# Patient Record
Sex: Female | Born: 2000 | Race: White | Hispanic: No | Marital: Married
Health system: Southern US, Community
[De-identification: ages and names within clinical notes are randomized; demographics above are authoritative.]

## PROBLEM LIST (undated history)

## (undated) DIAGNOSIS — M545 Low back pain: Secondary | ICD-10-CM

## (undated) DIAGNOSIS — T7840XA Allergy, unspecified, initial encounter: Secondary | ICD-10-CM

## (undated) DIAGNOSIS — R790 Abnormal level of blood mineral: Principal | ICD-10-CM

## (undated) DIAGNOSIS — R350 Frequency of micturition: Secondary | ICD-10-CM

## (undated) DIAGNOSIS — K5900 Constipation, unspecified: Secondary | ICD-10-CM

## (undated) DIAGNOSIS — E669 Obesity, unspecified: Secondary | ICD-10-CM

## (undated) DIAGNOSIS — J329 Chronic sinusitis, unspecified: Secondary | ICD-10-CM

## (undated) DIAGNOSIS — E301 Precocious puberty: Secondary | ICD-10-CM

## (undated) DIAGNOSIS — R110 Nausea: Secondary | ICD-10-CM

## (undated) DIAGNOSIS — J40 Bronchitis, not specified as acute or chronic: Secondary | ICD-10-CM

## (undated) DIAGNOSIS — K219 Gastro-esophageal reflux disease without esophagitis: Secondary | ICD-10-CM

## (undated) DIAGNOSIS — Z00129 Encounter for routine child health examination without abnormal findings: Secondary | ICD-10-CM

## (undated) DIAGNOSIS — R51 Headache: Secondary | ICD-10-CM

## (undated) HISTORY — DX: Precocious puberty: E30.1

## (undated) HISTORY — DX: Encounter for routine child health examination without abnormal findings: Z00.129

## (undated) HISTORY — DX: Nausea: R11.0

## (undated) HISTORY — DX: Constipation, unspecified: K59.00

## (undated) HISTORY — DX: Chronic sinusitis, unspecified: J32.9

## (undated) HISTORY — PX: TONSILLECTOMY AND ADENOIDECTOMY: SHX28

## (undated) HISTORY — DX: Bronchitis, not specified as acute or chronic: J40

## (undated) HISTORY — DX: Headache: R51

## (undated) HISTORY — DX: Gastro-esophageal reflux disease without esophagitis: K21.9

## (undated) HISTORY — DX: Low back pain: M54.5

## (undated) HISTORY — DX: Obesity, unspecified: E66.9

## (undated) HISTORY — DX: Abnormal level of blood mineral: R79.0

## (undated) HISTORY — DX: Frequency of micturition: R35.0

## (undated) HISTORY — DX: Allergy, unspecified, initial encounter: T78.40XA

---
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
## 2019-01-22 ENCOUNTER — Telehealth: Payer: Self-pay | Admitting: Physician Assistant

## 2019-01-22 DIAGNOSIS — Z20822 Contact with and (suspected) exposure to covid-19: Secondary | ICD-10-CM

## 2019-01-22 NOTE — Progress Notes (Signed)
E-Visit for Corona Virus Screening  Your current symptoms could be consistent with the coronavirus.  Many health care providers can now test patients at their office but not all are.  [facility] has multiple testing sites. For information on our [place] testing locations and hours go to [email or web address]  We are enrolling you in our [place] for [place] . Daily you will receive a questionnaire within the [place] website. Our COVID 19 response team will be monitoring your responses daily.  Testing Information: The COVID-19 Community Testing sites will begin testing BY APPOINTMENT ONLY.  You can schedule online at [email or web address]  If you do not have access to a smart phone or computer you may call [phone] for an appointment.   Additional testing sites in the Community:  . For [facility] Testing sites in [facility]  FaceUpdate.uy  . For Pop-up testing sites in [place]  BowlDirectory.co.uk  . For Testing sites with regular hours [email or web address]  . For [place] [email or web address]  . For [facility] BasicJet.[email or web address]  . For [facility] testing in [place] and [employer] [email or web address]  . For [facility] testing in [facility]   [email or web address].[email or web address]  For  more information about community testing call [phone]   Please quarantine yourself while awaiting your test results. Please stay home for a minimum of 10 days from the first day of illness with improving symptoms and you have had 24 hours of no fever (without the use of Tylenol (Acetaminophen)  Motrin (Ibuprofen) or any fever reducing medication).  Also - Do not get tested prior to returning to work because once you have had a positive test the test can stay positive for more then a month in some cases.   You should wear a mask or cloth face covering over your nose and mouth if you must be around other people or animals, including pets (even at home). Try to stay at least 6 feet away from other people. This will protect the people around you.  Please continue good preventive care measures, including:  frequent hand-washing, avoid touching your face, cover coughs/sneezes, stay out of crowds and keep a 6 foot distance from others.  COVID-19 is a respiratory illness with symptoms that are similar to the flu. Symptoms are typically mild to moderate, but there have been cases of severe illness and death due to the virus.   The following symptoms may appear 2-14 days after exposure: . Fever . Cough . Shortness of breath or difficulty breathing . Chills . Repeated shaking with chills . Muscle pain . Headache . Sore throat . New loss of taste or smell . Fatigue . Congestion or runny nose . Nausea or vomiting . Diarrhea  Go to the nearest hospital ED for assessment if fever/cough/breathlessness are severe or illness seems like a threat to life.  It is vitally important that if you feel that you have an infection such as this virus or any other virus that you stay home and away from places where you may spread it to others.  You should avoid contact with people age 5 and older.   You may also take acetaminophen (Tylenol) as needed for fever.  Reduce your risk of any infection by using the same precautions used for avoiding the common cold or flu:  Marland Kitchen Wash your hands often with soap and warm water for at least 20 seconds.  If soap and water are not readily available, use an alcohol-based hand sanitizer with at least  60% alcohol.  . If coughing or sneezing, cover your mouth and nose by  coughing or sneezing into the elbow areas of your shirt or coat, into a tissue or into your sleeve (not your hands). . Avoid shaking hands with others and consider head nods or verbal greetings only. . Avoid touching your eyes, nose, or mouth with unwashed hands.  . Avoid close contact with people who are sick. . Avoid places or events with large numbers of people in one location, like concerts or sporting events. . Carefully consider travel plans you have or are making. . If you are planning any travel outside or inside the [place], visit the CDC's Travelers' Health webpage for the latest health notices. . If you have some symptoms but not all symptoms, continue to monitor at home and seek medical attention if your symptoms worsen. . If you are having a medical emergency, call 911.  HOME CARE . Only take medications as instructed by your medical team. . Drink plenty of fluids and get plenty of rest. . A steam or ultrasonic humidifier can help if you have congestion.   GET HELP RIGHT AWAY IF YOU HAVE EMERGENCY WARNING SIGNS** FOR COVID-19. If you or someone is showing any of these signs seek emergency medical care immediately. Call 911 or proceed to your closest emergency facility if: . You develop worsening high fever. . Trouble breathing . Bluish lips or face . Persistent pain or pressure in the chest . New confusion . Inability to wake or stay awake . You cough up blood. . Your symptoms become more severe  **This list is not all possible symptoms. Contact your medical provider for any symptoms that are sever or concerning to you.  MAKE SURE YOU   Understand these instructions.  Will watch your condition.  Will get help right away if you are not doing well or get worse.  Your e-visit answers were reviewed by a board certified advanced clinical practitioner to complete your personal care plan.  Depending on the condition, your plan could have included both over the counter or  prescription medications.  If there is a problem please reply once you have received a response from your provider.  Your safety is important to [place].  If you have drug allergies check your prescription carefully.    You can use MyChart to ask questions about today's visit, request a non-urgent call back, or ask for a work or school excuse for 24 hours related to this e-Visit. If it has been greater than 24 hours you will need to follow up with your provider, or enter a new e-Visit to address those concerns. You will get an e-mail in the next two days asking about your experience.  I hope that your e-visit has been valuable and will speed your recovery. Thank you for using e-visits.   5 mins spent on chart

## 2019-03-06 ENCOUNTER — Ambulatory Visit: Payer: Self-pay | Admitting: Orthopedic Surgery

## 2019-03-06 ENCOUNTER — Encounter: Payer: Self-pay | Admitting: Orthopedic Surgery

## 2019-03-06 ENCOUNTER — Other Ambulatory Visit: Payer: Self-pay

## 2019-03-06 DIAGNOSIS — M7661 Achilles tendinitis, right leg: Secondary | ICD-10-CM

## 2019-03-06 MED ORDER — MELOXICAM 15 MG PO TABS: 15.0000 mg | ORAL_TABLET | Freq: Every day | ORAL | 0 refills | Status: DC

## 2019-03-06 MED ORDER — DICLOFENAC EPOLAMINE 1.3 % EX PTCH: 1.0000 | MEDICATED_PATCH | Freq: Two times a day (BID) | CUTANEOUS | 0 refills | Status: DC

## 2019-03-07 ENCOUNTER — Telehealth: Payer: Self-pay

## 2019-03-07 NOTE — Telephone Encounter (Signed)
Patient called stated that she is self pay and is unable to pay for the prescription she was prescribed by Dr Dean/Luke at her OV on 03/06/19 due to it costing her over $100 OOP. I advised that they could try OTC voltaren gel.

## 2019-03-08 ENCOUNTER — Encounter: Payer: Self-pay | Admitting: Orthopedic Surgery

## 2019-03-08 NOTE — Progress Notes (Signed)
Office Visit Note   Patient: Katherine Brown           Date of Birth: 04/11/2000           MRN: [id number] Visit Date: 03/06/2019 Requested by: Bradd Canary, MD [address] [place],  [place] [address] PCP: Bradd Canary, MD  Subjective: Chief Complaint  Patient presents with  . Right Ankle - Pain    HPI: Katherine Brown is a 19 y.o. female who presents to the office complaining of right heel pain.  Patient notes acute onset of right heel pain that began last Thursday after she was taking off her work boot.  Since then she notes constant pain that she localizes to the Achilles tendon.  She denies any acute injury.  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  She denies any popping/catching or any feeling of a pop.  She takes ibuprofen as needed to treat the pain.  She has never had a similar injury before.  She has never had surgery on that right heel.              ROS:  All systems reviewed are negative as they relate to the chief complaint within the history of present illness.  Patient denies fevers or chills.  Assessment & Plan: Visit Diagnoses:  1. Achilles tendinitis of right lower extremity     Plan: Patient is a 19 year old female who presents complaining of right Achilles pain.  Tendon is intact and most of her pain is noninsertional.  She has no tenderness over the insertion of the tendon.  Ultrasound was utilized to view both Achilles tendons and there is no discernible difference between the right and left tendon.  Discussed with patient that this may be a prodromal onset of pain prior to spontaneous rupture of the tendon.  However I think this is unlikely, after reviewing the ultrasound.  Plan to try a 3-week course of meloxicam with a 5-day course of a Flector patch.  She will use a heel lift in her boot to take pressure off of the Achilles tendon.  Provided work note for patient to stay out of work until  Monday.  Follow-up with the office if pain does not improve.  Follow-Up Instructions: No follow-ups on file.   Orders:  No orders of the defined types were placed in this encounter.  Meds ordered this encounter  Medications  . meloxicam (MOBIC) 15 MG tablet    Sig: Take 1 tablet (15 mg total) by mouth daily.    Dispense:  21 tablet    Refill:  0  . diclofenac (FLECTOR) 1.3 % PTCH    Sig: Place 1 patch onto the skin 2 (two) times daily.    Dispense:  10 patch    Refill:  0      Procedures: No procedures performed   Clinical Data: No additional findings.  Objective: Vital Signs: There were no vitals taken for this visit.  Physical Exam:  Constitutional: Patient appears well-developed HEENT:  Head: Normocephalic Eyes:EOM are normal Neck: Normal range of motion Cardiovascular: Normal rate Pulmonary/chest: Effort normal Neurologic: Patient is alert Skin: Skin is warm Psychiatric: Patient has normal mood and affect  Ortho Exam:  Right heel exam Area of most tenderness to palpation is over the watershed area of the Achilles tendon, a couple centimeters above the insertion of the tendon on the calcaneus.  Pain with single-leg  heel raise of the right heel.  Slightly decreased arch height of the right foot compared with the contralateral side. Achilles tendon is intact.  No significant tenderness in the retrocalcaneal space, on the insertion site of the Achilles tendon, medial/lateral malleolus, deltoid ligament, ATFL, CFL, midfoot, forefoot, plantar fascia.  [place] test is negative for any concern of tendon rupture.  No pain with single-leg heel raise of the left heel.  Specialty Comments:  No specialty comments available.  Imaging: No results found.   PMFS History: Patient Active Problem List   Diagnosis Date Noted  . Urinary frequency 08/30/2015  . Constipation 07/05/2015  . Abnormal blood level of copper 10/03/2014  . Low back pain 10/03/2014  . Dizziness and  giddiness 09/30/2014  . Allergic state 05/11/2014  . Obesity 05/11/2014  . Eczema 04/27/2012  . Reflux 11/02/2011  . Headache 10/30/2011  . Precocious puberty    Past Medical History:  Diagnosis Date  . Abnormal blood level of copper 10/03/2014  . Allergic state 05/11/2014  . Bronchitis 01/10/2012  . Constipation 07/05/2015  . Headache(784.0) 10/30/2011  . Low back pain 10/03/2014  . Nausea in pediatric patient 11/02/2011  . Obesity 05/11/2014  . Precocious puberty   . Reflux 11/02/2011  . Unspecified sinusitis (chronic) 01/10/2012  . Urinary frequency 08/30/2015  . [place] (well child check) 10/30/2011    Family History  Problem Relation Age of Onset  . Asthma Mother   . Hypertension Maternal Grandmother     Past Surgical History:  Procedure Laterality Date  . TONSILLECTOMY AND ADENOIDECTOMY  19 yrs old   Social History   Occupational History  . Not on file  Tobacco Use  . Smoking status: Never Smoker  . Smokeless tobacco: Never Used  Substance and Sexual Activity  . Alcohol use: No  . Drug use: No  . Sexual activity: Never    Birth control/protection: None

## 2019-03-11 ENCOUNTER — Encounter: Payer: Self-pay | Admitting: Orthopedic Surgery

## 2019-03-20 ENCOUNTER — Other Ambulatory Visit: Payer: Self-pay

## 2019-03-20 ENCOUNTER — Ambulatory Visit (INDEPENDENT_AMBULATORY_CARE_PROVIDER_SITE_OTHER): Payer: Self-pay | Admitting: Orthopedic Surgery

## 2019-03-20 DIAGNOSIS — M7661 Achilles tendinitis, right leg: Secondary | ICD-10-CM

## 2019-03-22 ENCOUNTER — Encounter: Payer: Self-pay | Admitting: Orthopedic Surgery

## 2019-03-22 NOTE — Progress Notes (Signed)
Office Visit Note   Patient: Katherine Brown           Date of Birth: 09-24-2000           MRN: [id number] Visit Date: 03/20/2019 Requested by: Bradd Canary, MD [address] [place],  [place] [address] PCP: Bradd Canary, MD  Subjective: Chief Complaint  Patient presents with  . Follow-up    HPI: Patient presents follow-up right Achilles pain.  She is doing some better using Voltaren gel.  It is difficult for her to walk and function in boots and tennis shoes.  She does better in her Birkenstock type shoes.  She finished the oral Mobic which helped.              ROS: All systems reviewed are negative as they relate to the chief complaint within the history of present illness.  Patient denies  fevers or chills.   Assessment & Plan: Visit Diagnoses:  1. Achilles tendinitis of right lower extremity     Plan: Impression is Achilles tendinitis.  Molded inserts would be the next step.  Continue stretching.  Mobic as needed.  Follow-up as needed.  Follow-Up Instructions: Return if symptoms worsen or fail to improve.   Orders:  No orders of the defined types were placed in this encounter.  No orders of the defined types were placed in this encounter.     Procedures: No procedures performed   Clinical Data: No additional findings.  Objective: Vital Signs: There were no vitals taken for this visit.  Physical Exam:   Constitutional: Patient appears well-developed HEENT:  Head: Normocephalic Eyes:EOM are normal Neck: Normal range of motion Cardiovascular: Normal rate Pulmonary/chest: Effort normal Neurologic: Patient is alert Skin: Skin is warm Psychiatric: Patient has normal mood and affect    Ortho Exam: Ortho exam demonstrates full active and passive range of motion of the ankle symmetric with the left-hand side.  Minimal tenderness and swelling around the watershed area of the Achilles tendon.  Pedal pulses palpable.  Plantar flexion  dorsiflexion intact with good strength.  Patient has mild pes planus but does have an arch and can invert heel when standing on her toes.  Specialty Comments:  No specialty comments available.  Imaging: No results found.   PMFS History: Patient Active Problem List   Diagnosis Date Noted  . Urinary frequency 08/30/2015  . Constipation 07/05/2015  . Abnormal blood level of copper 10/03/2014  . Low back pain 10/03/2014  . Dizziness and giddiness 09/30/2014  . Allergic state 05/11/2014  . Obesity 05/11/2014  . Eczema 04/27/2012  . Reflux 11/02/2011  . Headache 10/30/2011  . Precocious puberty    Past Medical History:  Diagnosis Date  . Abnormal blood level of copper 10/03/2014  . Allergic state 05/11/2014  . Bronchitis 01/10/2012  . Constipation 07/05/2015  . Headache(784.0) 10/30/2011  . Low back pain 10/03/2014  . Nausea in pediatric patient 11/02/2011  . Obesity 05/11/2014  . Precocious puberty   . Reflux 11/02/2011  . Unspecified sinusitis (chronic) 01/10/2012  . Urinary frequency 08/30/2015  . WCC (well child check) 10/30/2011    Family History  Problem Relation Age of Onset  . Asthma Mother   . Hypertension Maternal Grandmother     Past Surgical History:  Procedure Laterality Date  . TONSILLECTOMY AND ADENOIDECTOMY  19 yrs old   Social History   Occupational History  . Not on file  Tobacco Use  . Smoking status: Never  Smoker  . Smokeless tobacco: Never Used  Substance and Sexual Activity  . Alcohol use: No  . Drug use: No  . Sexual activity: Never    Birth control/protection: None

## 2019-09-27 IMAGING — CT CT ABD-PELV W/ CM
2 of 4 series · 15 of 46 positions shown, 17 images · IV contrast (iopamidol)
Comparison: None.

CLINICAL DATA: Vomiting and diarrhea since 11 a.m. with
periumbilical pain.

EXAM:
CT ABDOMEN AND PELVIS WITH CONTRAST
TECHNIQUE: Multidetector CT imaging of the abdomen and pelvis was performed
using the standard protocol following bolus administration of
intravenous contrast.
CONTRAST:  100mL 6YKMN1-0EE IOPAMIDOL (6YKMN1-0EE) INJECTION 61%

[Series 2: axial st · axial · 0.88mm/px · z∈[+560,+1016]mm · 12 of 101 slices shown, 14 images]
[im 5/101  soft-tissue]
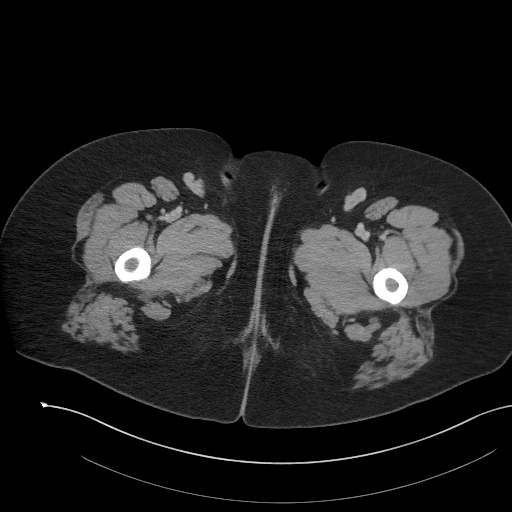
[im 5/101  bone]
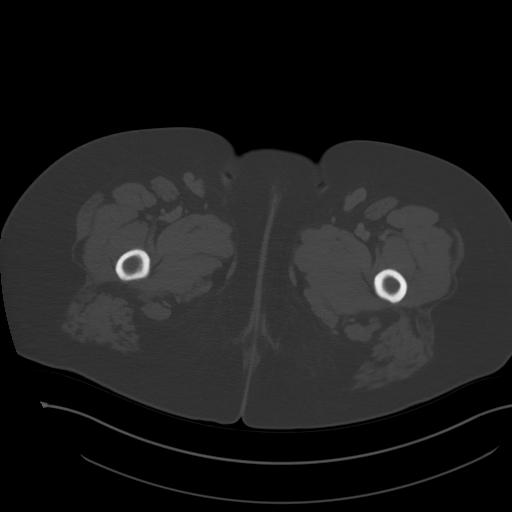
[im 14/101  soft-tissue]
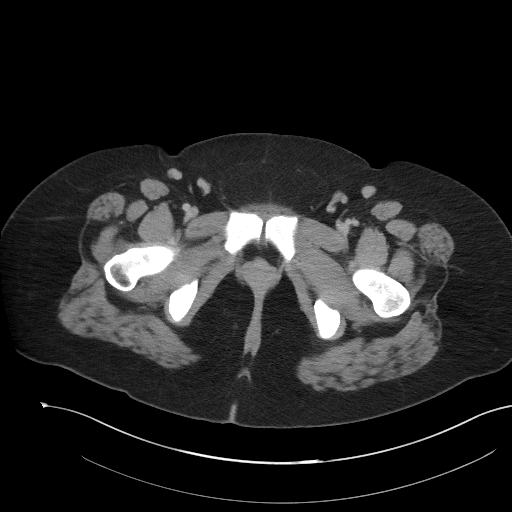
[im 22/101  soft-tissue]
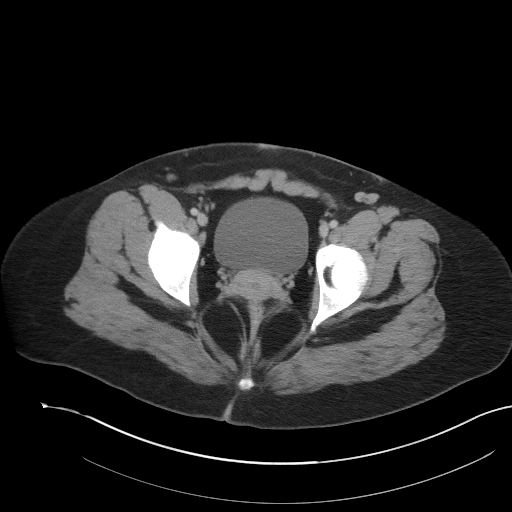
[im 31/101  soft-tissue]
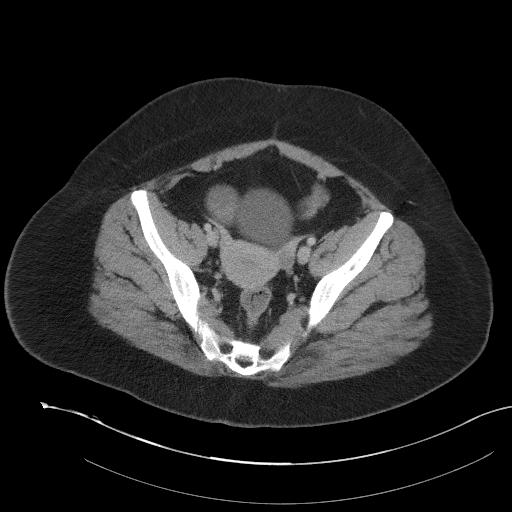
[im 40/101  soft-tissue]
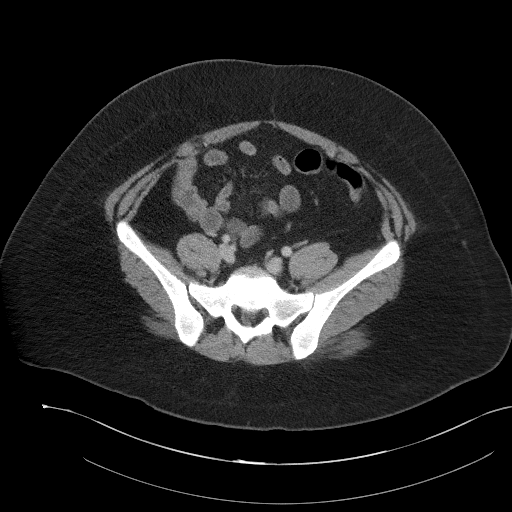
[im 48/101  soft-tissue]
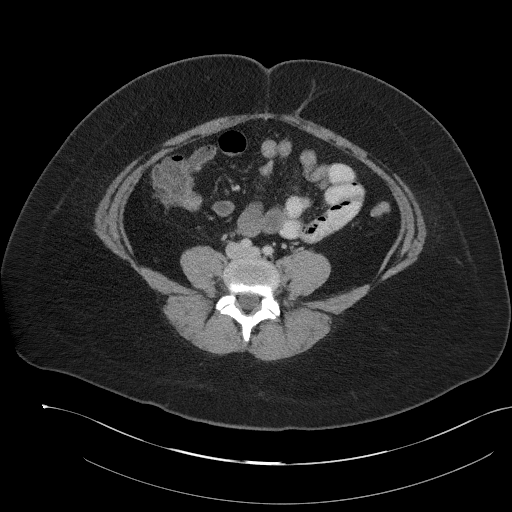
[im 53/101  soft-tissue]
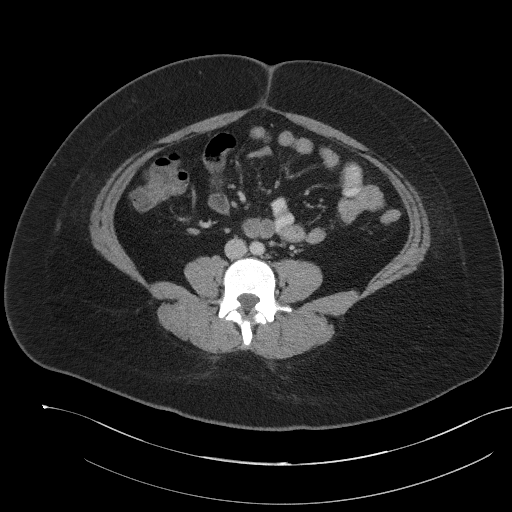
[im 61/101  soft-tissue]
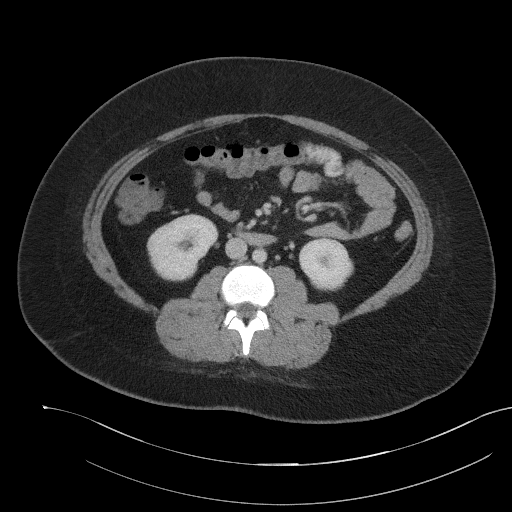
[im 70/101  soft-tissue]
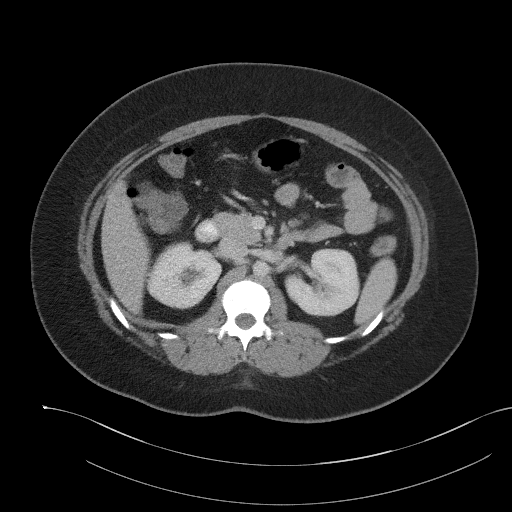
[im 70/101  bone]
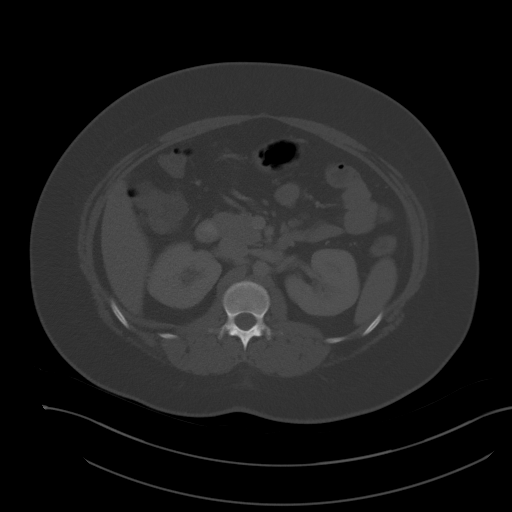
[im 79/101  soft-tissue]
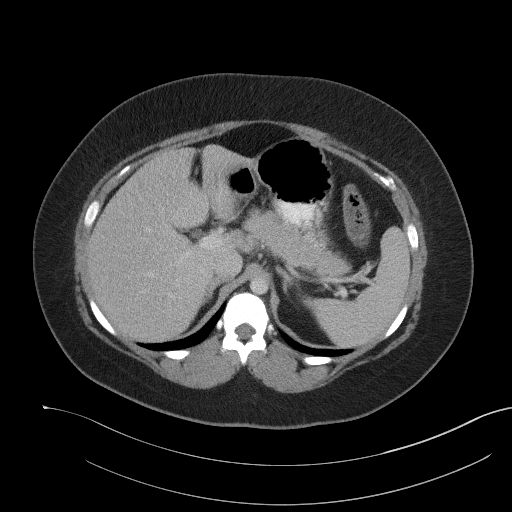
[im 87/101  soft-tissue]
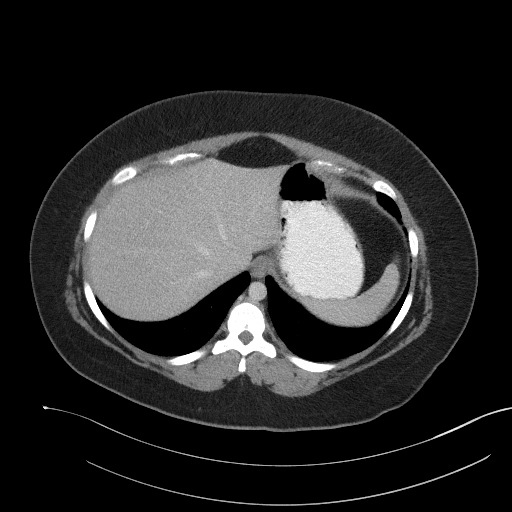
[im 96/101  soft-tissue]
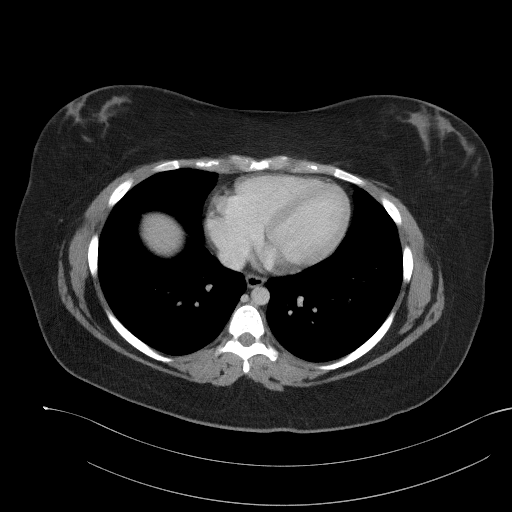

[Series 5: coronal st · coronal · 0.76mm/px · 3 of 108 slices shown]
[im 36/108  soft-tissue]
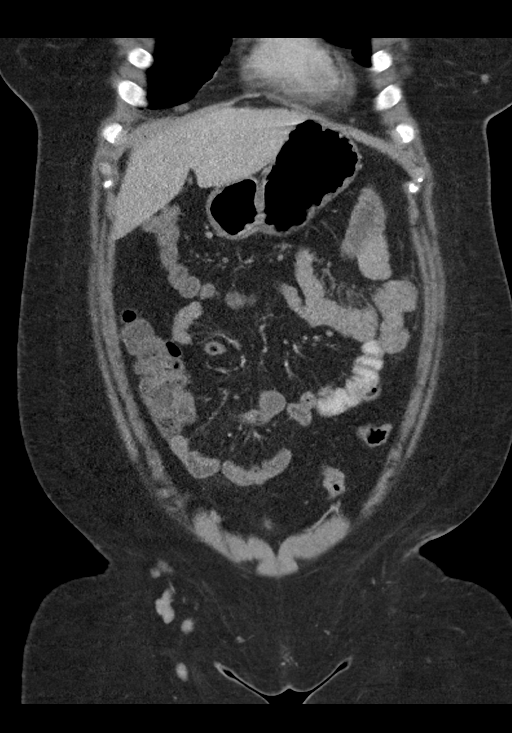
[im 48/108  soft-tissue]
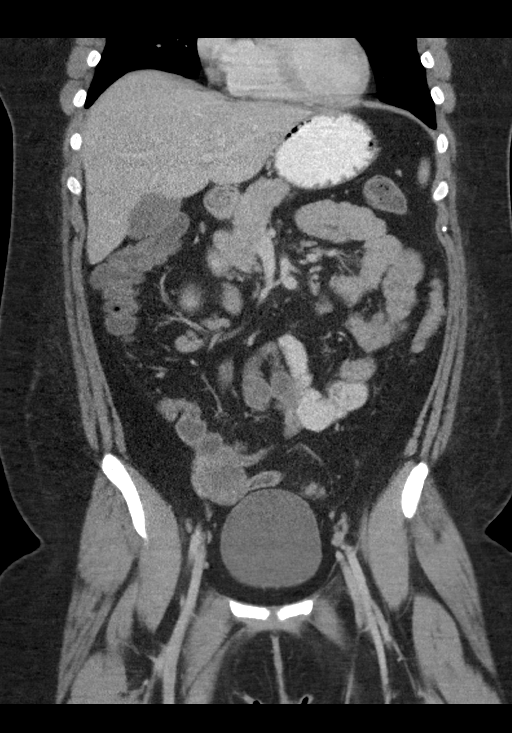
[im 60/108  soft-tissue]
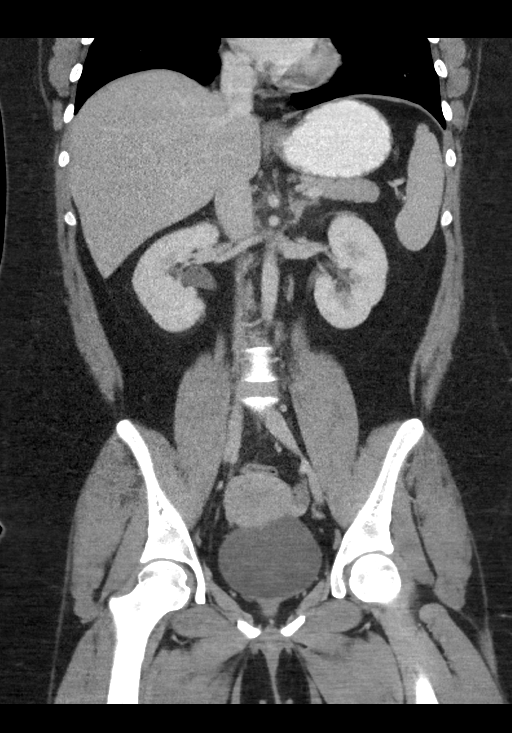

[15 of 46 positions shown; findings below may reference images not displayed]

FINDINGS: Lower chest: No acute abnormality.

Hepatobiliary: No focal liver abnormality is seen. No gallstones,
gallbladder wall thickening, or biliary dilatation.

Pancreas: Unremarkable. No pancreatic ductal dilatation or
surrounding inflammatory changes.

Spleen: Normal in size without focal abnormality.

Adrenals/Urinary Tract: Adrenal glands are unremarkable. Kidneys are
normal, without renal calculi, focal lesion, or hydronephrosis.
Bladder is unremarkable.

Stomach/Bowel: Normal caliber appendix without inflammation.
Contrast filled stomach without gastric outlet obstruction. The
duodenal sweep and ligament Treitz are normal. No small bowel
dilatation or obstruction.

Vascular/Lymphatic: No significant vascular findings are present. No
enlarged abdominal or pelvic lymph nodes.

Reproductive: Uterus is unremarkable. There is a 4 cm simple
appearing right adnexal cyst likely of ovarian etiology. No
worrisome features.

Other: No free air nor free fluid.

Musculoskeletal: No acute or significant osseous findings.
IMPRESSION: 1. 4 cm almost certainly to be benign appearing right ovarian cyst.
No additional followup recommendations per consensus guidelines
given size and patient demographics.
2. Normal-appearing appendix. No acute bowel obstruction or
inflammation.

## 2019-12-13 ENCOUNTER — Encounter: Payer: Self-pay | Admitting: Internal Medicine

## 2019-12-13 ENCOUNTER — Ambulatory Visit (INDEPENDENT_AMBULATORY_CARE_PROVIDER_SITE_OTHER): Payer: Commercial Managed Care - PPO | Admitting: Internal Medicine

## 2019-12-13 ENCOUNTER — Other Ambulatory Visit: Payer: Self-pay

## 2019-12-13 VITALS — BP 133/90 | HR 79 | Temp 98.4°F | Resp 18 | Ht 60.0 in | Wt 230.1 lb

## 2019-12-13 DIAGNOSIS — L309 Dermatitis, unspecified: Secondary | ICD-10-CM | POA: Diagnosis not present

## 2019-12-13 DIAGNOSIS — N920 Excessive and frequent menstruation with regular cycle: Secondary | ICD-10-CM | POA: Diagnosis not present

## 2019-12-13 DIAGNOSIS — R030 Elevated blood-pressure reading, without diagnosis of hypertension: Secondary | ICD-10-CM

## 2019-12-13 DIAGNOSIS — Z7689 Persons encountering health services in other specified circumstances: Secondary | ICD-10-CM | POA: Diagnosis not present

## 2019-12-13 DIAGNOSIS — Z124 Encounter for screening for malignant neoplasm of cervix: Secondary | ICD-10-CM

## 2019-12-13 MED ORDER — AUROVELA FE 1/20 1-20 MG-MCG PO TABS: 1.0000 | ORAL_TABLET | Freq: Every day | ORAL | 5 refills | Status: DC

## 2019-12-13 MED ORDER — TRIAMCINOLONE ACETONIDE 0.1 % EX CREA: 1.0000 "application " | TOPICAL_CREAM | Freq: Two times a day (BID) | CUTANEOUS | 1 refills | Status: AC

## 2019-12-13 NOTE — Assessment & Plan Note (Signed)
Diet modification and moderate exercise advised. 

## 2019-12-13 NOTE — Assessment & Plan Note (Signed)
BP: 133/90  Family h/o hypertension Advised to follow low salt diet and perform moderate exercise

## 2019-12-13 NOTE — Assessment & Plan Note (Signed)
Care established Previous chart reviewed History and medications reviewed with the patient 

## 2019-12-13 NOTE — Patient Instructions (Signed)
Please continue to take OCPs regularly.  Please apply Triamcinolone cream locally for eczema. Okay to take Zyrtec or Benadryl as needed for allergies.  Please follow low salt diet and perform moderate exercise/walking at least 150 mins/week.

## 2019-12-13 NOTE — Progress Notes (Signed)
New Patient Office Visit  Subjective:  Patient ID: Katherine Brown, female    DOB: [birth year]  Age: 19 y.o. MRN: [id number]  CC:  Chief Complaint  Patient presents with  . New Patient (Initial Visit)    Pt has rash on right arm needs birth control refilled also     HPI Katherine Brown is a 19 year old female with PMH of eczema, menorrhagia and morbid obesity who presents for establishing care.  She has been in good health overall. She requests refills for her OCPs. Her menstrual cycles have been regular with OCPs. She denies any headache, dizziness, dyspnea, menstrual cramping or abdominal pain.  She has a rash over her right elbow area, which is itching and mentions h/o eczema. She has used steroid creams in the past. She denies any known allergen, denies any new soap, detergent or jewelery exposed to that area. She denies any new medications.  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 She has had extensive workup for it, but she was told that she doesn't have Wilson disease and it is a normal variant for her.  She says that she is allergic flu vaccine, but mentions local reaction. She has not had COVID vaccine yet.  Past Medical History:  Diagnosis Date  . Abnormal blood level of copper 10/03/2014  . Allergic state 05/11/2014  . Bronchitis 01/10/2012  . Constipation 07/05/2015  . Headache(784.0) 10/30/2011  . Low back pain 10/03/2014  . Nausea in pediatric patient 11/02/2011  . Obesity 05/11/2014  . Precocious puberty   . Reflux 11/02/2011  . Unspecified sinusitis (chronic) 01/10/2012  . Urinary frequency 08/30/2015  . Kermit (well child check) 10/30/2011    Past Surgical History:  Procedure Laterality Date  . TONSILLECTOMY AND ADENOIDECTOMY  19 yrs old    Family History  Problem Relation Age of Onset  . Asthma Mother   . Hypertension Maternal Grandmother     Social History   Socioeconomic History  . Marital status: Married     Spouse name: Not on file  . Number of children: Not on file  . Years of education: Not on file  . Highest education level: Not on file  Occupational History  . Not on file  Tobacco Use  . Smoking status: Never Smoker  . Smokeless tobacco: Never Used  Substance and Sexual Activity  . Alcohol use: No  . Drug use: No  . Sexual activity: Yes    Birth control/protection: None  Other Topics Concern  . Not on file  Social History Narrative  . Not on file   Social Determinants of Health   Financial Resource Strain: Not on file  Food Insecurity: Not on file  Transportation Needs: Not on file  Physical Activity: Not on file  Stress: Not on file  Social Connections: Not on file  Intimate Partner Violence: Not on file    ROS Review of Systems  Constitutional: Negative for chills and fever.  HENT: Negative for congestion, sinus pressure, sinus pain and sore throat.   Eyes: Negative for pain and discharge.  Respiratory: Negative for cough and shortness of breath.   Cardiovascular: Negative for chest pain and palpitations.  Gastrointestinal: Negative for abdominal pain, constipation, diarrhea, nausea and vomiting.  Endocrine: Negative for polydipsia and polyuria.  Genitourinary: Negative for dysuria, hematuria, vaginal discharge and vaginal pain.  Musculoskeletal: Negative for neck pain and neck stiffness.  Skin: Negative for rash.  Allergic/Immunologic: Positive for environmental  allergies.  Neurological: Negative for dizziness and weakness.  Psychiatric/Behavioral: Negative for agitation and behavioral problems.    Objective:   Today's Vitals: BP 133/90 (BP Location: Left Arm, Patient Position: Sitting)   Pulse 79   Temp 98.4 F (36.9 C) (Oral)   Resp 18   Ht 5' (1.524 m)   Wt 230 lb 1.9 oz (104.4 kg)   SpO2 99%   BMI 44.94 kg/m   Physical Exam Vitals reviewed.  Constitutional:      General: She is not in acute distress.    Appearance: She is not diaphoretic.   HENT:     Head: Normocephalic and atraumatic.     Nose: Nose normal.     Mouth/Throat:     Mouth: Mucous membranes are moist.  Eyes:     General: No scleral icterus.    Extraocular Movements: Extraocular movements intact.     Pupils: Pupils are equal, round, and reactive to light.  Cardiovascular:     Rate and Rhythm: Normal rate and regular rhythm.     Pulses: Normal pulses.     Heart sounds: No murmur heard.   Pulmonary:     Breath sounds: Normal breath sounds. No wheezing or rales.  Abdominal:     Palpations: Abdomen is soft.     Tenderness: There is no abdominal tenderness.  Musculoskeletal:     Cervical back: Neck supple. No tenderness.     Right lower leg: No edema.     Left lower leg: No edema.  Skin:    General: Skin is warm.     Comments: Erythematous rash noted over medical aspect of right elbow  Neurological:     General: No focal deficit present.     Mental Status: She is alert and oriented to person, place, and time.  Psychiatric:        Mood and Affect: Mood normal.        Behavior: Behavior normal.      Assessment & Plan:   Problem List Items Addressed This Visit      Encounter to establish care - Primary   Care established Previous chart reviewed History and medications reviewed with the patient     Relevant Orders  CBC with Differential  CMP14+EGFR  Hemoglobin A1c  Lipid panel  TSH + free T4    Musculoskeletal and Integument   Eczema    Kenalog prescribed Zyrtec or Benadryl PRN      Relevant Medications   triamcinolone (KENALOG) 0.1 %     Other      Morbid obesity (HCC)    Diet modification and moderate exercise advised      Menorrhagia with regular cycle    Has been taking OCPs - Aurovela Fe, refilled Referred to OB/GYN for PAP smear.      Relevant Medications   AUROVELA FE 1/20 1-20 MG-MCG tablet   Prehypertension    BP: 133/90  Family h/o hypertension Advised to follow low salt diet and perform moderate exercise         Other Visit Diagnoses    Routine cervical smear       Relevant Orders   Ambulatory referral to Obstetrics / Gynecology      Outpatient Encounter Medications as of 12/13/2019  Medication Sig  . [DISCONTINUED] AUROVELA FE 1/20 1-20 MG-MCG tablet Take 1 tablet by mouth at bedtime.  Rosalia Hammers FE 1/20 1-20 MG-MCG tablet Take 1 tablet by mouth at bedtime.  . triamcinolone (KENALOG) 0.1 % Apply 1  application topically 2 (two) times daily.  . [DISCONTINUED] diclofenac (FLECTOR) 1.3 % PTCH Place 1 patch onto the skin 2 (two) times daily. (Patient not taking: Reported on 12/13/2019)  . [DISCONTINUED] famotidine (PEPCID) 20 MG tablet Take 1 tablet (20 mg total) by mouth 2 (two) times daily. (Patient not taking: Reported on 12/13/2019)  . [DISCONTINUED] hydrocortisone cream 1 % Apply to affected area 2 times daily (Patient not taking: Reported on 12/13/2019)  . [DISCONTINUED] ibuprofen (ADVIL,MOTRIN) 800 MG tablet Take 1 tablet (800 mg total) by mouth every 8 (eight) hours as needed. (Patient not taking: Reported on 12/13/2019)  . [DISCONTINUED] loperamide (IMODIUM) 2 MG capsule Take 1 capsule (2 mg total) by mouth 4 (four) times daily as needed for diarrhea or loose stools. (Patient not taking: No sig reported)  . [DISCONTINUED] meloxicam (MOBIC) 15 MG tablet Take 1 tablet (15 mg total) by mouth daily. (Patient not taking: Reported on 12/13/2019)  . [DISCONTINUED] metoCLOPramide (REGLAN) 10 MG tablet Take 1 tablet (10 mg total) by mouth every 8 (eight) hours as needed for nausea. (Patient not taking: Reported on 12/13/2019)  . [DISCONTINUED] ondansetron (ZOFRAN ODT) 4 MG disintegrating tablet Take 1 tablet (4 mg total) by mouth every 4 (four) hours as needed for nausea or vomiting. (Patient not taking: Reported on 12/13/2019)  . [DISCONTINUED] ranitidine (ZANTAC) 150 MG tablet Take 1 tablet (150 mg total) by mouth 2 (two) times daily. (Patient not taking: Reported on 12/13/2019)   No  facility-administered encounter medications on file as of 12/13/2019.    Follow-up: Return in about 6 months (around 06/12/2020).   Lindell Spar, MD

## 2019-12-13 NOTE — Assessment & Plan Note (Signed)
Has been taking OCPs - Aurovela Fe, refilled Referred to OB/GYN for PAP smear.

## 2019-12-13 NOTE — Assessment & Plan Note (Signed)
Kenalog prescribed Zyrtec or Benadryl PRN

## 2019-12-14 LAB — CMP14+EGFR
ALT: 16 IU/L (ref 0–32)
AST: 20 IU/L (ref 0–40)
Albumin/Globulin Ratio: 1.5 (ref 1.2–2.2)
Albumin: 4.3 g/dL (ref 3.9–5.0)
Alkaline Phosphatase: 72 IU/L (ref 44–121)
BUN/Creatinine Ratio: 13 (ref 9–23)
BUN: 10 mg/dL (ref 6–20)
Bilirubin Total: 0.4 mg/dL (ref 0.0–1.2)
CO2: 21 mmol/L (ref 20–29)
Calcium: 9.8 mg/dL (ref 8.7–10.2)
Chloride: 102 mmol/L (ref 96–106)
Creatinine, Ser: 0.76 mg/dL (ref 0.57–1.00)
GFR calc Af Amer: 130 mL/min/{1.73_m2} (ref >59)
GFR calc non Af Amer: 112 mL/min/{1.73_m2} (ref >59)
Globulin, Total: 2.9 g/dL (ref 1.5–4.5)
Glucose: 93 mg/dL (ref 65–99)
Potassium: 4.3 mmol/L (ref 3.5–5.2)
Sodium: 138 mmol/L (ref 134–144)
Total Protein: 7.2 g/dL (ref 6.0–8.5)

## 2019-12-14 LAB — LIPID PANEL
Chol/HDL Ratio: 4.4 ratio (ref 0.0–4.4)
Cholesterol, Total: 202 mg/dL — ABNORMAL HIGH (ref 100–199)
HDL: 46 mg/dL (ref >39)
LDL Chol Calc (NIH): 135 mg/dL — ABNORMAL HIGH (ref 0–99)
Triglycerides: 116 mg/dL (ref 0–149)
VLDL Cholesterol Cal: 21 mg/dL (ref 5–40)

## 2019-12-14 LAB — CBC WITH DIFFERENTIAL/PLATELET
Basophils Absolute: 0.1 10*3/uL (ref 0.0–0.2)
Basos: 1 %
EOS (ABSOLUTE): 0.2 10*3/uL (ref 0.0–0.4)
Eos: 2 %
Hematocrit: 41.2 % (ref 34.0–46.6)
Hemoglobin: 13.5 g/dL (ref 11.1–15.9)
Immature Grans (Abs): 0 10*3/uL (ref 0.0–0.1)
Immature Granulocytes: 0 %
Lymphocytes Absolute: 2.9 10*3/uL (ref 0.7–3.1)
Lymphs: 29 %
MCH: 26.8 pg (ref 26.6–33.0)
MCHC: 32.8 g/dL (ref 31.5–35.7)
MCV: 82 fL (ref 79–97)
Monocytes Absolute: 0.6 10*3/uL (ref 0.1–0.9)
Monocytes: 6 %
Neutrophils Absolute: 6.4 10*3/uL (ref 1.4–7.0)
Neutrophils: 62 %
Platelets: 296 10*3/uL (ref 150–450)
RBC: 5.04 x10E6/uL (ref 3.77–5.28)
RDW: 14.4 % (ref 11.7–15.4)
WBC: 10.2 10*3/uL (ref 3.4–10.8)

## 2019-12-14 LAB — HEMOGLOBIN A1C
Est. average glucose Bld gHb Est-mCnc: 114 mg/dL
Hgb A1c MFr Bld: 5.6 % (ref 4.8–5.6)

## 2019-12-14 LAB — TSH+FREE T4
Free T4: 1.31 ng/dL (ref 0.82–1.77)
TSH: 1.57 u[IU]/mL (ref 0.450–4.500)

## 2019-12-16 NOTE — Progress Notes (Signed)
Pt advised of lab results with verbal understanding  

## 2020-02-04 ENCOUNTER — Encounter: Payer: Commercial Managed Care - PPO | Admitting: Women's Health

## 2020-03-24 ENCOUNTER — Other Ambulatory Visit: Payer: Self-pay

## 2020-03-24 ENCOUNTER — Ambulatory Visit
Admission: RE | Admit: 2020-03-24 | Discharge: 2020-03-24 | Disposition: A | Discharge status: Home / Self Care | Payer: Commercial Managed Care - PPO | Source: Ambulatory Visit | Attending: Physician Assistant | Admitting: Physician Assistant

## 2020-03-24 VITALS — BP 156/90 | HR 124 | Temp 98.8°F | Resp 18

## 2020-03-24 DIAGNOSIS — T148XXA Other injury of unspecified body region, initial encounter: Secondary | ICD-10-CM

## 2020-03-24 MED ORDER — METHOCARBAMOL 500 MG PO TABS: 500.0000 mg | ORAL_TABLET | Freq: Four times a day (QID) | ORAL | 0 refills | Status: DC

## 2020-03-24 MED ORDER — IBUPROFEN 800 MG PO TABS: 800.0000 mg | ORAL_TABLET | Freq: Three times a day (TID) | ORAL | 0 refills | Status: DC | PRN
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
## 2020-03-24 NOTE — Discharge Instructions (Signed)
Return if any problems.
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
## 2020-03-26 ENCOUNTER — Ambulatory Visit: Payer: Commercial Managed Care - PPO

## 2020-03-26 ENCOUNTER — Encounter: Payer: Self-pay | Admitting: Orthopaedic Surgery

## 2020-03-26 ENCOUNTER — Ambulatory Visit (INDEPENDENT_AMBULATORY_CARE_PROVIDER_SITE_OTHER): Payer: Commercial Managed Care - PPO | Admitting: Orthopaedic Surgery

## 2020-03-26 ENCOUNTER — Other Ambulatory Visit: Payer: Self-pay

## 2020-03-26 VITALS — Ht 60.0 in | Wt 229.0 lb

## 2020-03-26 DIAGNOSIS — M542 Cervicalgia: Secondary | ICD-10-CM | POA: Diagnosis not present

## 2020-03-26 DIAGNOSIS — M25511 Pain in right shoulder: Secondary | ICD-10-CM

## 2020-03-26 DIAGNOSIS — Z6841 Body Mass Index (BMI) 40.0 and over, adult: Secondary | ICD-10-CM | POA: Diagnosis not present

## 2020-03-26 MED ORDER — METHOCARBAMOL 500 MG PO TABS: 500.0000 mg | ORAL_TABLET | Freq: Three times a day (TID) | ORAL | 1 refills | Status: DC

## 2020-03-26 MED ORDER — NAPROXEN 500 MG PO TABS: 500.0000 mg | ORAL_TABLET | Freq: Two times a day (BID) | ORAL | 5 refills | Status: AC
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
## 2020-03-26 NOTE — Patient Instructions (Signed)
Work:  Limit duty, work one half day, four hours.

## 2020-03-27 ENCOUNTER — Telehealth: Payer: Self-pay | Admitting: Orthopaedic Surgery

## 2020-03-27 ENCOUNTER — Encounter: Payer: Self-pay | Admitting: Orthopaedic Surgery

## 2020-03-27 NOTE — Telephone Encounter (Signed)
Patient called to relay that her work note for light duty is not going to be accepted by her employer, [employer]. Patient states Dr Hilda Lias had already approved out of work if light duty not accepted or not available. Updated work note issued; patient aware.

## 2020-04-01 ENCOUNTER — Encounter (HOSPITAL_COMMUNITY): Payer: Self-pay | Admitting: Specialist

## 2020-04-01 ENCOUNTER — Other Ambulatory Visit: Payer: Self-pay

## 2020-04-01 ENCOUNTER — Ambulatory Visit (HOSPITAL_COMMUNITY): Payer: Commercial Managed Care - PPO | Attending: Orthopaedic Surgery | Admitting: Specialist

## 2020-04-01 DIAGNOSIS — R29898 Other symptoms and signs involving the musculoskeletal system: Secondary | ICD-10-CM | POA: Insufficient documentation

## 2020-04-01 DIAGNOSIS — M25511 Pain in right shoulder: Secondary | ICD-10-CM | POA: Insufficient documentation

## 2020-04-01 DIAGNOSIS — M542 Cervicalgia: Secondary | ICD-10-CM | POA: Diagnosis present

## 2020-04-01 DIAGNOSIS — M25611 Stiffness of right shoulder, not elsewhere classified: Secondary | ICD-10-CM | POA: Insufficient documentation
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
## 2020-04-01 NOTE — Patient Instructions (Signed)
1) AROM: Lateral Neck Flexion   Slowly tilt head toward one shoulder, then the other. Hold each position ____ seconds. Repeat ____ times per set. Do ____ sets per session. Do ____ sessions per day.  http://orth.exer.us/296   Copyright  VHI. All rights reserved.  2) AROM: Neck Extension   Bend head backward. Hold ____ seconds. Repeat ____ times per set. Do ____ sets per session. Do ____ sessions per day.  http://orth.exer.us/300   Copyright  VHI. All rights reserved.  3) AROM: Neck Flexion   Bend head forward. Hold ____ seconds. Repeat ____ times per set. Do ____ sets per session. Do ____ sessions per day.  http://orth.exer.us/298   Copyright  VHI. All rights reserved.  4) AROM: Neck Rotation   Turn head slowly to look over one shoulder, then the other. Hold each position ____ seconds. Repeat ____ times per set. Do ____ sets per session. Do ____ sessions per day.  http://orth.exer.us/294   Copyright  VHI. All rights reserved.   Flexibility: Upper Trapezius Stretch   Gently grasp right side of head while reaching behind back with other hand. Tilt head away until a gentle stretch is felt. Hold ____ seconds. Repeat ____ times per set. Do ____ sets per session. Do ____ sessions per day.  http://orth.exer.us/340   Levator Stretch   Grasp seat or sit on hand on side to be stretched. Turn head toward other side and look down. Use hand on head to gently stretch neck in that position. Hold ____ seconds. Repeat on other side. Repeat ____ times. Do ____ sessions per day.  http://[email or web address].[email or web address]/30   Scapular Retraction (Standing)   With arms at sides, pinch shoulder blades together. Repeat ____ times per set. Do ____ sets per session. Do ____ sessions per day.  http://orth.exer.us/944   Flexibility: Neck Retraction   Pull head straight back, keeping eyes and jaw level. Repeat ____ times per set. Do ____ sets per session. Do ____ sessions per  day.  http://orth.exer.us/344   Posture - Sitting   Sit upright, head facing forward. Try using a roll to support lower back. Keep shoulders relaxed, and avoid rounded back. Keep hips level with knees. Avoid crossing legs for long periods.   Flexibility: Corner Stretch   Standing in corner with hands just above shoulder level and feet ____ inches from corner, lean forward until a comfortable stretch is felt across chest. Hold ____ seconds. Repeat ____ times per set. Do ____ sets per session. Do ____ sessions per day.  http://orth.exer.us/342   Copyright  VHI. All rights reserved.  Complete the following exercises 2-3 times a day.     Scapular Retraction (Standing)   With arms at sides, pinch shoulder blades together. Repeat __10__ times per set. Do __1__ sets per session. Do __2__ sessions per day.  http://orth.exer.us/944   Copyright  VHI. All rights reserved.    Posterior Capsule Stretch   Stand or sit, one arm across body so hand rests over opposite shoulder. Gently push on crossed elbow with other hand until stretch is felt in shoulder of crossed arm. Hold _10-15__ seconds.  Repeat _2__ times per session. Do ___ sessions per day.   Wall Flexion  Slide your arm up the wall or door frame until a stretch is felt in your shoulder . Hold for 10-15 seconds. Complete 2 times     Shoulder Abduction Stretch  Stand side ways by a wall with affected up on wall. Gently step in toward wall to feel stretch. Hold for 10-15  seconds. Complete 2 times.

## 2020-04-07 ENCOUNTER — Ambulatory Visit (HOSPITAL_COMMUNITY): Payer: Commercial Managed Care - PPO | Attending: Orthopaedic Surgery | Admitting: Occupational Therapy

## 2020-04-07 DIAGNOSIS — M25511 Pain in right shoulder: Secondary | ICD-10-CM | POA: Insufficient documentation

## 2020-04-07 DIAGNOSIS — R29898 Other symptoms and signs involving the musculoskeletal system: Secondary | ICD-10-CM | POA: Insufficient documentation

## 2020-04-07 DIAGNOSIS — M542 Cervicalgia: Secondary | ICD-10-CM | POA: Insufficient documentation

## 2020-04-07 DIAGNOSIS — M25611 Stiffness of right shoulder, not elsewhere classified: Secondary | ICD-10-CM | POA: Insufficient documentation

## 2020-04-09 ENCOUNTER — Encounter: Payer: Self-pay | Admitting: Orthopaedic Surgery

## 2020-04-09 ENCOUNTER — Other Ambulatory Visit: Payer: Self-pay

## 2020-04-09 ENCOUNTER — Ambulatory Visit (INDEPENDENT_AMBULATORY_CARE_PROVIDER_SITE_OTHER): Payer: Commercial Managed Care - PPO | Admitting: Orthopaedic Surgery

## 2020-04-09 VITALS — BP 129/90 | HR 80 | Ht 60.0 in | Wt 232.0 lb

## 2020-04-09 DIAGNOSIS — M542 Cervicalgia: Secondary | ICD-10-CM

## 2020-04-09 DIAGNOSIS — Z6841 Body Mass Index (BMI) 40.0 and over, adult: Secondary | ICD-10-CM

## 2020-04-09 DIAGNOSIS — M25511 Pain in right shoulder: Secondary | ICD-10-CM | POA: Diagnosis not present
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
## 2020-04-14 ENCOUNTER — Other Ambulatory Visit: Payer: Self-pay

## 2020-04-14 ENCOUNTER — Encounter (HOSPITAL_COMMUNITY): Payer: Self-pay | Admitting: Specialist

## 2020-04-14 ENCOUNTER — Ambulatory Visit (HOSPITAL_COMMUNITY): Payer: Commercial Managed Care - PPO | Admitting: Specialist

## 2020-04-14 DIAGNOSIS — R29898 Other symptoms and signs involving the musculoskeletal system: Secondary | ICD-10-CM

## 2020-04-14 DIAGNOSIS — M25611 Stiffness of right shoulder, not elsewhere classified: Secondary | ICD-10-CM

## 2020-04-14 DIAGNOSIS — M542 Cervicalgia: Secondary | ICD-10-CM

## 2020-04-14 DIAGNOSIS — M25511 Pain in right shoulder: Secondary | ICD-10-CM | POA: Diagnosis present

## 2020-04-14 NOTE — Therapy (Signed)
[facility] [address] [place], [place], [address] Phone: [phone]   Fax:  [phone]  Occupational Therapy Treatment  Patient Details  Name: Katherine Brown MRN: [id number] Date of Birth: 24-Nov-2000 Referring Provider (OT): Dr. Darreld Mclean   Encounter Date: 04/14/2020   OT End of Session - 04/14/20 1511    Visit Number 2    Number of Visits 12    Date for OT Re-Evaluation 05/13/20    Authorization Type UMR    OT Start Time 1305    OT Stop Time 1345    OT Time Calculation (min) 40 min    Activity Tolerance Patient tolerated treatment well    Behavior During Therapy [facility] for tasks assessed/performed           Past Medical History:  Diagnosis Date  . Abnormal blood level of copper 10/03/2014  . Allergic state 05/11/2014  . Bronchitis 01/10/2012  . Constipation 07/05/2015  . Headache(784.0) 10/30/2011  . Low back pain 10/03/2014  . Nausea in pediatric patient 11/02/2011  . Obesity 05/11/2014  . Precocious puberty   . Reflux 11/02/2011  . Unspecified sinusitis (chronic) 01/10/2012  . Urinary frequency 08/30/2015  . WCC (well child check) 10/30/2011    Past Surgical History:  Procedure Laterality Date  . TONSILLECTOMY AND ADENOIDECTOMY  20 yrs old    There were no vitals filed for this visit.   Subjective Assessment - 04/14/20 1509    Subjective  S:  It feels about the same.    Pain Score 6     Pain Location Neck    Pain Orientation Right    Pain Descriptors / Indicators Aching;Burning;Heaviness    Pain Type Acute pain    Pain Radiating Towards neck and radiating to upper arm    Pain Onset Today    Pain Frequency Constant    Aggravating Factors  movement    Pain Relieving Factors minimal with heat              [facility] OT Assessment - 04/14/20 0001      Assessment   Medical Diagnosis Right Shoulder and Cervical Strain    Referring Provider (OT) Dr. Darreld Mclean    Onset Date/Surgical Date 03/22/20       Precautions   Precautions None      Restrictions   Weight Bearing Restrictions No                    OT Treatments/Exercises (OP) - 04/14/20 0001      Manual Therapy   Manual Therapy Myofascial release;Manual Traction    Manual therapy comments manual therapy competed seperately from all other interventions this date    Myofascial Release myofascial release and manual stretching to right upper arm, scapular region, shoulder, SCM, trapezius, platysmus to decrease pain and restrictions    Manual Traction gentle cervical traction to decrease pain and fascial restrictions                    OT Short Term Goals - 04/14/20 1514      OT SHORT TERM GOAL #1   Title Patient will be educated and independent with HEP for improved cervical and shoulder mobility.    Time 6    Period Weeks    Status On-going    Target Date 05/13/20      OT SHORT TERM GOAL #2   Title Patient will improve right shoulder and cervical a/rom to WNL in  order to reach overhead, behind back, and look to right and left when driving without pain.    Time 6    Period Weeks    Status On-going      OT SHORT TERM GOAL #3   Title Patient will increase right shoulder strength to WNL in order to return to golfing and fishing without difficulty.    Time 6    Period Weeks    Status On-going      OT SHORT TERM GOAL #4   Title Patient will decrease pain in her right shoulder and cervical region to 2/10 or better when completing desired daily tasks.    Time 6    Period Weeks    Status On-going      OT SHORT TERM GOAL #5   Title Patient will decrease fascial restrictions to trace in right shoulder and cervical region.    Time 6    Period Weeks    Status On-going                    Plan - 04/14/20 1511    Clinical Impression Statement A:  Patient with signicant restrictions and tenderness at [facility] joint of right shoulder and along SCM muscle from origin to insertion.  Minimal vasomotor  response present with manual therapy techniques this date.  Encouraged patient to continue with heat and stretching and will continue to address restrictions with manual  therapy in clinic    Body Structure / Function / Physical Skills ADL;Strength;Pain;GMC;UE functional use;IADL;ROM;Fascial restriction;FMC;Muscle spasms    Plan P:  Continue manual therapy to decreas pain and fascial restrictions, attempt SCM stretch in clinic and if can perform add to HEP, isometrics for shoulder external rotation, internal rotation, abducation for [facility] joint strengthening.           Patient will benefit from skilled therapeutic intervention in order to improve the following deficits and impairments:   Body Structure / Function / Physical Skills: ADL,Strength,Pain,GMC,UE functional use,IADL,ROM,Fascial restriction,FMC,Muscle spasms       Visit Diagnosis: Acute pain of right shoulder  Stiffness of right shoulder, not elsewhere classified  Cervicalgia  Other symptoms and signs involving the musculoskeletal system    Problem List Patient Active Problem List   Diagnosis Date Noted  . Menorrhagia with regular cycle 12/13/2019  . Prehypertension 12/13/2019  . Abnormal blood level of copper 10/03/2014  . Morbid obesity (HCC) 05/11/2014  . Eczema 04/27/2012  . GERD (gastroesophageal reflux disease) 01/18/2012  . Encounter to establish care 10/30/2011    Shirlean Mylar, [place], OTR/L [phone]  04/14/2020, 3:16 PM  [facility] [address] [place], [place], [address] Phone: [phone]   Fax:  [phone]  Name: Katherine Brown MRN: [id number] Date of Birth: 2000-10-14

## 2020-04-16 ENCOUNTER — Other Ambulatory Visit: Payer: Self-pay

## 2020-04-16 ENCOUNTER — Ambulatory Visit (HOSPITAL_COMMUNITY): Payer: Commercial Managed Care - PPO

## 2020-04-16 ENCOUNTER — Encounter (HOSPITAL_COMMUNITY): Payer: Self-pay

## 2020-04-16 DIAGNOSIS — M542 Cervicalgia: Secondary | ICD-10-CM

## 2020-04-16 DIAGNOSIS — R29898 Other symptoms and signs involving the musculoskeletal system: Secondary | ICD-10-CM

## 2020-04-16 DIAGNOSIS — M25511 Pain in right shoulder: Secondary | ICD-10-CM

## 2020-04-16 DIAGNOSIS — M25611 Stiffness of right shoulder, not elsewhere classified: Secondary | ICD-10-CM

## 2020-04-16 NOTE — Patient Instructions (Addendum)
Sternocleidomastoid (SCM) Stretch  In sitting, place one hand over the front of your collarbone, then extend your head backwards and to each side. Gentle stretch only. Hold for 20-30 seconds. Complete 2 times.      Yoga Strap Upper trap stretch  [email or web address]

## 2020-04-16 NOTE — Therapy (Signed)
[facility] [address] [place], [place], [address] Phone: [phone]   Fax:  [phone]  Occupational Therapy Treatment  Patient Details  Name: Katherine Brown MRN: [id number] Date of Birth: [birth year] Referring Provider (OT): Dr. Darreld Mclean   Encounter Date: 04/16/2020   OT End of Session - 04/16/20 1141    Visit Number 3    Number of Visits 12    Date for OT Re-Evaluation 05/13/20    Authorization Type UMR    OT Start Time 0820    OT Stop Time 0855    OT Time Calculation (min) 35 min    Activity Tolerance Patient tolerated treatment well    Behavior During Therapy [facility] for tasks assessed/performed           Past Medical History:  Diagnosis Date  . Abnormal blood level of copper 10/03/2014  . Allergic state 05/11/2014  . Bronchitis 01/10/2012  . Constipation 07/05/2015  . Headache(784.0) 10/30/2011  . Low back pain 10/03/2014  . Nausea in pediatric patient 11/02/2011  . Obesity 05/11/2014  . Precocious puberty   . Reflux 11/02/2011  . Unspecified sinusitis (chronic) 01/10/2012  . Urinary frequency 08/30/2015  . WCC (well child check) 10/30/2011    Past Surgical History:  Procedure Laterality Date  . TONSILLECTOMY AND ADENOIDECTOMY  20 yrs old    There were no vitals filed for this visit.   Subjective Assessment - 04/16/20 0850    Subjective  S: It's hurting.    Currently in Pain? Yes    Pain Score 4     Pain Location Neck   shoulder   Pain Orientation Right    Pain Descriptors / Indicators Aching;Sore    Pain Type Acute pain    Pain Radiating Towards neck and radiating to upper arm    Pain Onset Yesterday    Pain Frequency Constant    Aggravating Factors  movement and stretching    Pain Relieving Factors heat helps some    Effect of Pain on Daily Activities moderate effect    Multiple Pain Sites No              [facility] OT Assessment - 04/16/20 1140      Assessment   Medical Diagnosis Right Shoulder and  Cervical Strain      Precautions   Precautions None                    OT Treatments/Exercises (OP) - 04/16/20 0856      Exercises   Exercises Neck;Shoulder      Neck Exercises: Stretches   Other Neck Stretches Upper trapezius and cervical mobility stretch with mobility belt; left side; 2 times.      Shoulder Exercises: Supine   Horizontal ABduction PROM;5 reps    External Rotation PROM;5 reps    Internal Rotation PROM;5 reps    Flexion PROM;5 reps    ABduction PROM;5 reps      Shoulder Exercises: Standing   Other Standing Exercises Y arm lift off; 5X      Manual Therapy   Manual Therapy Myofascial release    Manual therapy comments manual therapy competed seperately from all other interventions this date    Myofascial Release myofascial release and manual stretching to right upper arm, scapular region, shoulder, SCM, trapezius, platysmus to decrease pain and restrictions                  OT Education - 04/16/20 1140  Education Details yoga strap trapezius stretch with verbal instruction to add cervical rotation (youtube video), SCM stretch. Provided education to not overstretch when completing HEP stretches.    Person(s) Educated Patient    Methods Explanation;Handout;Verbal cues;Demonstration    Comprehension Verbalized understanding;Returned demonstration            OT Short Term Goals - 04/14/20 1514      OT SHORT TERM GOAL #1   Title Patient will be educated and independent with HEP for improved cervical and shoulder mobility.    Time 6    Period Weeks    Status On-going    Target Date 05/13/20      OT SHORT TERM GOAL #2   Title Patient will improve right shoulder and cervical a/rom to WNL in order to reach overhead, behind back, and look to right and left when driving without pain.    Time 6    Period Weeks    Status On-going      OT SHORT TERM GOAL #3   Title Patient will increase right shoulder strength to WNL in order to return to  golfing and fishing without difficulty.    Time 6    Period Weeks    Status On-going      OT SHORT TERM GOAL #4   Title Patient will decrease pain in her right shoulder and cervical region to 2/10 or better when completing desired daily tasks.    Time 6    Period Weeks    Status On-going      OT SHORT TERM GOAL #5   Title Patient will decrease fascial restrictions to trace in right shoulder and cervical region.    Time 6    Period Weeks    Status On-going                    Plan - 04/16/20 1142    Clinical Impression Statement A: Patient presents with increased pain during passive stretching once shoulder flexion and abduction reaching just past 90 degrees. No muscle tightness or joint restrictions felt during stretching. Pain is largest defict and stops patient from tolerating full ROM. Min fascial restrictions noted in right shoulder just anterior to [facility] joint and moderate fascial restrictions noted in right side cervical region along SCM. Manual techniques were completed to address. Pt did have good response when palpated although did not verbalize any relief. Mobility strap used to trial upper trapezius and cervical rotation stretch. Narrow yoga strap would be preferable and OT will order one to try next session. VC for form and technique.    Body Structure / Function / Physical Skills ADL;Strength;Pain;GMC;UE functional use;IADL;ROM;Fascial restriction;FMC;Muscle spasms    Plan P: resume manual cervical traction. Ask about neck position when sleeping and make recommendations for adjustments as needed.    OT Home Exercise Plan eval:  shoulder and cervical stretches, cervical a/rom 4/14: SCM stretch, yoga strap upper trapezius stretch.    Consulted and Agree with Plan of Care Patient           Patient will benefit from skilled therapeutic intervention in order to improve the following deficits and impairments:   Body Structure / Function / Physical Skills:  ADL,Strength,Pain,GMC,UE functional use,IADL,ROM,Fascial restriction,FMC,Muscle spasms       Visit Diagnosis: Cervicalgia  Stiffness of right shoulder, not elsewhere classified  Acute pain of right shoulder  Other symptoms and signs involving the musculoskeletal system    Problem List Patient Active Problem List   Diagnosis  Date Noted  . Menorrhagia with regular cycle 12/13/2019  . Prehypertension 12/13/2019  . Abnormal blood level of copper 10/03/2014  . Morbid obesity (HCC) 05/11/2014  . Eczema 04/27/2012  . GERD (gastroesophageal reflux disease) 01/18/2012  . Encounter to establish care 10/30/2011    Limmie Patricia, OTR/L,CBIS  [phone]  04/16/2020, 11:56 AM  [place] [facility] [address] [place], [place], [address] Phone: [phone]   Fax:  [phone]  Name: Katherine Brown MRN: [id number] Date of Birth: 02-12-2000

## 2020-04-20 ENCOUNTER — Telehealth: Payer: Self-pay | Admitting: Orthopaedic Surgery

## 2020-04-20 NOTE — Telephone Encounter (Signed)
Patient returned call; aware. Katherine Brown will also message her claims examiner to notify; aware we also called insurer, but were unable to directly speak with her claims contact.

## 2020-04-20 NOTE — Telephone Encounter (Signed)
Called patient, to relay that her form has been completed, and has been faxed to [facility]; no answer, was unable to leave message. Had spoken with patient earlier today and patient was aware we would have form sent today.

## 2020-04-22 ENCOUNTER — Encounter (HOSPITAL_COMMUNITY): Payer: Commercial Managed Care - PPO | Admitting: Occupational Therapy

## 2020-04-23 ENCOUNTER — Ambulatory Visit (INDEPENDENT_AMBULATORY_CARE_PROVIDER_SITE_OTHER): Payer: Commercial Managed Care - PPO | Admitting: Orthopaedic Surgery

## 2020-04-23 ENCOUNTER — Encounter: Payer: Self-pay | Admitting: Orthopaedic Surgery

## 2020-04-23 ENCOUNTER — Other Ambulatory Visit: Payer: Self-pay

## 2020-04-23 VITALS — BP 142/84 | HR 87 | Ht 60.0 in | Wt 232.0 lb

## 2020-04-23 DIAGNOSIS — M542 Cervicalgia: Secondary | ICD-10-CM | POA: Diagnosis not present

## 2020-04-23 DIAGNOSIS — Z6841 Body Mass Index (BMI) 40.0 and over, adult: Secondary | ICD-10-CM

## 2020-04-23 DIAGNOSIS — M25511 Pain in right shoulder: Secondary | ICD-10-CM

## 2020-04-23 NOTE — Progress Notes (Signed)
Patient OA:Katherine Brown, female DOB:[birth year], 20 y.o. TKZ:[id number]  Chief Complaint  Patient presents with  . Neck Pain    HPI  Katherine Brown is a 20 y.o. female who has increasing pain of the right shoulder and decreased ROM.  She is going to OT and I have reviewed the notes.  She has pain with abduction and forward flexion.  She has no new trauma.  I am concerned about rotator cuff injury and I will order MRI.   Body mass index is 45.31 kg/m.  The patient meets the AMA guidelines for Morbid (severe) obesity with a BMI > 40.0 and I have recommended weight loss.   ROS  Review of Systems  Constitutional: Positive for activity change.  Musculoskeletal: Positive for arthralgias and neck pain.  All other systems reviewed and are negative.   All other systems reviewed and are negative.  The following is a summary of the past history medically, past history surgically, known current medicines, social history and family history.  This information is gathered electronically by the computer from prior information and documentation.  I review this each visit and have found including this information at this point in the chart is beneficial and informative.    Past Medical History:  Diagnosis Date  . Abnormal blood level of copper 10/03/2014  . Allergic state 05/11/2014  . Bronchitis 01/10/2012  . Constipation 07/05/2015  . Headache(784.0) 10/30/2011  . Low back pain 10/03/2014  . Nausea in pediatric patient 11/02/2011  . Obesity 05/11/2014  . Precocious puberty   . Reflux 11/02/2011  . Unspecified sinusitis (chronic) 01/10/2012  . Urinary frequency 08/30/2015  . WCC (well child check) 10/30/2011    Past Surgical History:  Procedure Laterality Date  . TONSILLECTOMY AND ADENOIDECTOMY  20 yrs old    Family History  Problem Relation Age of Onset  . Asthma Mother   . Hypertension Maternal Grandmother     Social History Social History   Tobacco Use  . Smoking status: Never  Smoker  . Smokeless tobacco: Never Used  Substance Use Topics  . Alcohol use: No  . Drug use: No    No Known Allergies  Current Outpatient Medications  Medication Sig Dispense Refill  . AUROVELA FE 1/20 1-20 MG-MCG tablet Take 1 tablet by mouth at bedtime. 28 tablet 5  . ibuprofen (ADVIL) 800 MG tablet Take 1 tablet (800 mg total) by mouth every 8 (eight) hours as needed. 30 tablet 0  . methocarbamol (ROBAXIN) 500 MG tablet Take 1 tablet (500 mg total) by mouth 3 (three) times daily. 60 tablet 1  . naproxen (NAPROSYN) 500 MG tablet Take 1 tablet (500 mg total) by mouth 2 (two) times daily with a meal. 60 tablet 5  . triamcinolone (KENALOG) 0.1 % Apply 1 application topically 2 (two) times daily. 30 g 1   No current facility-administered medications for this visit.     Physical Exam  Blood pressure (!) 142/84, pulse 87, height 5' (1.524 m), weight 232 lb (105.2 kg).  Constitutional: overall normal hygiene, normal nutrition, well developed, normal grooming, normal body habitus. Assistive device:none  Musculoskeletal: gait and station Limp none, muscle tone and strength are normal, no tremors or atrophy is present.  .  Neurological: coordination overall normal.  Deep tendon reflex/nerve stretch intact.  Sensation normal.  Cranial nerves II-XII intact.   Skin:   Normal overall no scars, lesions, ulcers or rashes. No psoriasis.  Psychiatric: Alert and oriented x 3.  Recent memory intact, remote  memory unclear.  Normal mood and affect. Well groomed.  Good eye contact.  Cardiovascular: overall no swelling, no varicosities, no edema bilaterally, normal temperatures of the legs and arms, no clubbing, cyanosis and good capillary refill. Examination of right Upper Extremity is done.  Inspection:   Overall:  Elbow non-tender without crepitus or defects, forearm non-tender without crepitus or defects, wrist non-tender without crepitus or defects, hand non-tender.    Shoulder: with  glenohumeral joint tenderness, without effusion.   Upper arm:  without swelling and tenderness   Range of motion:   Overall:  Full range of motion of the elbow, full range of motion of wrist and full range of motion in fingers.   Shoulder:  right  140 degrees forward flexion; 110 degrees abduction; 30 degrees internal rotation, 30 degrees external rotation, 10 degrees extension, 40 degrees adduction.   Stability:   Overall:  Shoulder, elbow and wrist stable   Strength and Tone:   Overall full shoulder muscles strength, full upper arm strength and normal upper arm bulk and tone.   Lymphatic: palpation is normal.  All other systems reviewed and are negative   The patient has been educated about the nature of the problem(s) and counseled on treatment options.  The patient appeared to understand what I have discussed and is in agreement with it.  Encounter Diagnoses  Name Primary?  . Acute pain of right shoulder Yes  . Neck pain on right side   . Body mass index 40.0-44.9, adult (HCC)   . Morbid obesity (HCC)     PLAN Call if any problems.  Precautions discussed.  Continue current medications.   Return to clinic 3 weeks   Continue OT.  Get MRI of the right shoulder.  Electronically Signed Darreld Mclean, MD 4/21/202210:59 AM

## 2020-04-23 NOTE — Patient Instructions (Signed)
OUT OF WORK ?

## 2020-04-24 ENCOUNTER — Ambulatory Visit (HOSPITAL_COMMUNITY): Payer: Commercial Managed Care - PPO | Admitting: Specialist

## 2020-04-24 ENCOUNTER — Encounter (HOSPITAL_COMMUNITY): Payer: Self-pay | Admitting: Specialist

## 2020-04-24 DIAGNOSIS — M25511 Pain in right shoulder: Secondary | ICD-10-CM

## 2020-04-24 DIAGNOSIS — M542 Cervicalgia: Secondary | ICD-10-CM

## 2020-04-24 DIAGNOSIS — M25611 Stiffness of right shoulder, not elsewhere classified: Secondary | ICD-10-CM

## 2020-04-24 NOTE — Therapy (Signed)
[place], [place], [address] Phone: [phone]   Fax:  [phone]  Occupational Therapy Treatment  Patient Details  Name: Katherine Brown MRN: [id number] Date of Birth: 2000-03-17 Referring Provider (OT): Dr. Sanjuana Kava   Encounter Date: 04/24/2020   OT End of Session - 04/24/20 1453    Visit Number 4    Number of Visits 12    Date for OT Re-Evaluation 05/13/20    Authorization Type UMR    Authorization - Visit Number 3    Authorization - Number of Visits 25    OT Start Time 0905    OT Stop Time 0950    OT Time Calculation (min) 45 min    Activity Tolerance Patient tolerated treatment well    Behavior During Therapy [facility] for tasks assessed/performed           Past Medical History:  Diagnosis Date  . Abnormal blood level of copper 10/03/2014  . Allergic state 05/11/2014  . Bronchitis 01/10/2012  . Constipation 07/05/2015  . Headache(784.0) 10/30/2011  . Low back pain 10/03/2014  . Nausea in pediatric patient 11/02/2011  . Obesity 05/11/2014  . Precocious puberty   . Reflux 11/02/2011  . Unspecified sinusitis (chronic) 01/10/2012  . Urinary frequency 08/30/2015  . [place] (well child check) 10/30/2011    Past Surgical History:  Procedure Laterality Date  . TONSILLECTOMY AND ADENOIDECTOMY  20 yrs old    There were no vitals filed for this visit.   Subjective Assessment - 04/24/20 1447    Subjective  S:  After the last visit it felt better for 2 days and then it went back to hurting just like before.  I have to have an MRI.    Currently in Pain? Yes    Pain Score 4     Pain Location Neck    Pain Orientation Right    Pain Descriptors / Indicators Aching    Pain Type Acute pain              [facility] OT Assessment - 04/24/20 0001      Assessment   Medical Diagnosis Right Shoulder and Cervical Strain    Referring Provider (OT) Dr. Sanjuana Kava      Precautions   Precautions None                     OT Treatments/Exercises (OP) - 04/24/20 0001      Shoulder Exercises: Standing   Extension Theraband;10 reps    Theraband Level (Shoulder Extension) Level 2 (Red)    Row Theraband;10 reps    Theraband Level (Shoulder Row) Level 2 (Red)    Retraction Theraband;10 reps    Theraband Level (Shoulder Retraction) Level 2 (Red)      Shoulder Exercises: ROM/Strengthening   UBE (Upper Arm Bike) attempted in reverse and could not complete - felt tight popping sensation in anterior shoulder.  forward 2 minutes at 1.0 at 4.0 speed    Prot/Ret//Elev/Dep 1 min with shoulder flexed to 90 and 1 min with shoulder abducted to 90      Manual Therapy   Manual Therapy Myofascial release    Manual therapy comments manual therapy competed seperately from all other interventions this date    Myofascial Release myofascial release and manual stretching to right upper arm, scapular region, shoulder, SCM, trapezius, platysmus to decrease pain and restrictions    Manual Traction gentle cervical traction to decrease pain and fascial restrictions  OT Education - 04/24/20 1452    Education Details educated patient to sleep on her back with one pillow with towel roll in curve of neck for support and pain relief.    Person(s) Educated Patient    Methods Explanation;Demonstration    Comprehension Verbalized understanding            OT Short Term Goals - 04/14/20 1514      OT SHORT TERM GOAL #1   Title Patient will be educated and independent with HEP for improved cervical and shoulder mobility.    Time 6    Period Weeks    Status On-going    Target Date 05/13/20      OT SHORT TERM GOAL #2   Title Patient will improve right shoulder and cervical a/rom to WNL in order to reach overhead, behind back, and look to right and left when driving without pain.    Time 6    Period Weeks    Status On-going      OT SHORT TERM GOAL #3   Title Patient will increase right  shoulder strength to WNL in order to return to golfing and fishing without difficulty.    Time 6    Period Weeks    Status On-going      OT SHORT TERM GOAL #4   Title Patient will decrease pain in her right shoulder and cervical region to 2/10 or better when completing desired daily tasks.    Time 6    Period Weeks    Status On-going      OT SHORT TERM GOAL #5   Title Patient will decrease fascial restrictions to trace in right shoulder and cervical region.    Time 6    Period Weeks    Status On-going                    Plan - 04/24/20 1453    Clinical Impression Statement A:  patient with increased vasomotor response and release with manual therapy technique to anterior shoulder/ ac joint region.  paitent to continues to be very tender with palpation at ac joint and scm joint as well as cervical region.  added scapular stability exercises for improved scapular alignment.    Body Structure / Function / Physical Skills ADL;Strength;Pain;GMC;UE functional use;IADL;ROM;Fascial restriction;FMC;Muscle spasms    Plan P:  follow up on sleeping position - was back with neck roll helpful?  increase to 3-4 minutes on ube forward and attempt 1 min in reverse .           Patient will benefit from skilled therapeutic intervention in order to improve the following deficits and impairments:   Body Structure / Function / Physical Skills: ADL,Strength,Pain,GMC,UE functional use,IADL,ROM,Fascial restriction,FMC,Muscle spasms       Visit Diagnosis: Cervicalgia  Stiffness of right shoulder, not elsewhere classified  Acute pain of right shoulder    Problem List Patient Active Problem List   Diagnosis Date Noted  . Menorrhagia with regular cycle 12/13/2019  . Prehypertension 12/13/2019  . Abnormal blood level of copper 10/03/2014  . Morbid obesity ([place]) 05/11/2014  . Eczema 04/27/2012  . GERD (gastroesophageal reflux disease) 01/18/2012  . Encounter to establish care  10/30/2011    Vangie Bicker, [place], OTR/L [phone]  04/24/2020, 3:00 PM  [place], [place], [address] Phone: [phone]   Fax:  [phone]  Name: Katherine Brown MRN: [id number] Date of Birth: 12/15/2000

## 2020-04-29 ENCOUNTER — Encounter (HOSPITAL_COMMUNITY): Payer: Commercial Managed Care - PPO

## 2020-05-01 ENCOUNTER — Encounter (HOSPITAL_COMMUNITY): Payer: Commercial Managed Care - PPO

## 2020-05-05 ENCOUNTER — Ambulatory Visit (HOSPITAL_COMMUNITY): Payer: Commercial Managed Care - PPO | Admitting: Occupational Therapy

## 2020-05-07 ENCOUNTER — Ambulatory Visit (HOSPITAL_COMMUNITY): Payer: Commercial Managed Care - PPO | Admitting: Occupational Therapy

## 2020-05-08 ENCOUNTER — Other Ambulatory Visit: Payer: Self-pay | Admitting: Internal Medicine

## 2020-05-08 DIAGNOSIS — N920 Excessive and frequent menstruation with regular cycle: Secondary | ICD-10-CM

## 2020-05-11 ENCOUNTER — Ambulatory Visit (HOSPITAL_COMMUNITY)
Admission: RE | Admit: 2020-05-11 | Discharge: 2020-05-11 | Disposition: A | Discharge status: Home / Self Care | Payer: Commercial Managed Care - PPO | Source: Ambulatory Visit | Attending: Orthopaedic Surgery | Admitting: Orthopaedic Surgery

## 2020-05-11 ENCOUNTER — Other Ambulatory Visit: Payer: Self-pay

## 2020-05-11 DIAGNOSIS — M25511 Pain in right shoulder: Secondary | ICD-10-CM | POA: Diagnosis present

## 2020-05-12 ENCOUNTER — Telehealth (HOSPITAL_COMMUNITY): Payer: Self-pay

## 2020-05-12 ENCOUNTER — Ambulatory Visit (HOSPITAL_COMMUNITY): Payer: Commercial Managed Care - PPO | Attending: Orthopaedic Surgery

## 2020-05-12 ENCOUNTER — Encounter (HOSPITAL_COMMUNITY): Payer: Self-pay

## 2020-05-12 DIAGNOSIS — M25611 Stiffness of right shoulder, not elsewhere classified: Secondary | ICD-10-CM | POA: Insufficient documentation

## 2020-05-12 DIAGNOSIS — M542 Cervicalgia: Secondary | ICD-10-CM | POA: Insufficient documentation

## 2020-05-12 DIAGNOSIS — R29898 Other symptoms and signs involving the musculoskeletal system: Secondary | ICD-10-CM | POA: Insufficient documentation

## 2020-05-12 DIAGNOSIS — M25511 Pain in right shoulder: Secondary | ICD-10-CM | POA: Insufficient documentation

## 2020-05-12 NOTE — Telephone Encounter (Signed)
Attempted to call patient regarding her 2nd no show after missing today's appointment. Unable to leave a message as number continuously rang. Will send a patient message via MyChart.   Limmie Patricia, OTR/L,CBIS  [phone]

## 2020-05-14 ENCOUNTER — Ambulatory Visit (HOSPITAL_COMMUNITY): Payer: Commercial Managed Care - PPO

## 2020-05-14 ENCOUNTER — Encounter (HOSPITAL_COMMUNITY): Payer: Self-pay

## 2020-05-14 ENCOUNTER — Other Ambulatory Visit: Payer: Self-pay

## 2020-05-14 DIAGNOSIS — M25611 Stiffness of right shoulder, not elsewhere classified: Secondary | ICD-10-CM

## 2020-05-14 DIAGNOSIS — M25511 Pain in right shoulder: Secondary | ICD-10-CM

## 2020-05-14 DIAGNOSIS — M542 Cervicalgia: Secondary | ICD-10-CM | POA: Diagnosis present

## 2020-05-14 DIAGNOSIS — R29898 Other symptoms and signs involving the musculoskeletal system: Secondary | ICD-10-CM | POA: Diagnosis present

## 2020-05-14 NOTE — Patient Instructions (Signed)
Complete 1-2 times a day.  BALL ON WALL - Use washcloth instead of ball.   While standing and holding a washcloth against door/wall make small circles at shoulder level for 1 minute.  Repeat while holding your arm out to the side for 1 minute.       Wall V slides with Theraband  Place a band loop around hands/forearms and face a wall. Extend both arms diagonally into a V shape on the wall.  Lower arms slowly back into neutral position. Repeat.   Repeat 10-15 times.

## 2020-05-18 NOTE — Therapy (Signed)
Katherine Brown, [place], [address] Phone: [phone]   Fax:  [phone]  Occupational Therapy Treatment Reassessment/re-cert Patient Details  Name: Katherine Brown MRN: [id number] Date of Birth: 06/19/2000 Referring Provider (OT): Dr. Sanjuana Kava   Encounter Date: 05/14/2020     05/14/20 0846  OT Visits / Re-Eval  Visit Number 5  Number of Visits 12  Date for OT Re-Evaluation 06/11/20  Authorization  Authorization Type UMR  Authorization - Visit Number 4  Authorization - Number of Visits 25  OT Time Calculation  OT Start Time 0815  OT Stop Time 0853  OT Time Calculation (min) 38 min  End of Session  Activity Tolerance Patient tolerated treatment well  Behavior During Therapy [facility] for tasks assessed/performed    Past Medical History:  Diagnosis Date  . Abnormal blood level of copper 10/03/2014  . Allergic state 05/11/2014  . Bronchitis 01/10/2012  . Constipation 07/05/2015  . Headache(784.0) 10/30/2011  . Low back pain 10/03/2014  . Nausea in pediatric patient 11/02/2011  . Obesity 05/11/2014  . Precocious puberty   . Reflux 11/02/2011  . Unspecified sinusitis (chronic) 01/10/2012  . Urinary frequency 08/30/2015  . [place] (well child check) 10/30/2011    Past Surgical History:  Procedure Laterality Date  . TONSILLECTOMY AND ADENOIDECTOMY  20 yrs old    There were no vitals filed for this visit.   Subjective Assessment - 05/18/20 0827    Subjective  S: I think therapy is helping. I had a MRI completed. I don't have the results.    Currently in Pain? Yes    Pain Score 5     Pain Location Shoulder    Pain Orientation Right;Posterior    Pain Descriptors / Indicators Throbbing;Constant    Pain Type Acute pain    Pain Radiating Towards front of shoulder to back of neck    Pain Onset More than a month ago    Pain Frequency Constant    Aggravating Factors  lifting arm weight    Pain Relieving Factors stretching,  heat    Effect of Pain on Daily Activities mod effect    Multiple Pain Sites No                          05/14/20 9485  Assessment  Medical Diagnosis Right Shoulder and Cervical Strain  Referring Provider (OT) Dr. Sanjuana Kava  Onset Date/Surgical Date 03/22/20  Precautions  Precautions None  Prior Function  Level of Independence Independent  Observation/Other Assessments  Observations proximal shoulder assessed during rthymic shoulder stability test with arm in flexion at 90 degrees. Pain felt at 1' followed by task terminated at 1'40" due to fatigue and pain.  Focus on Therapeutic Outcomes (FOTO)  50/100  AROM  Overall AROM Comments assessed in seated, external and internal rotation with shoulder adducted  AROM Assessment Site Shoulder;Cervical  Right/Left Shoulder Right  Right Shoulder Flexion 130 Degrees (previous: same)  Right Shoulder ABduction 120 Degrees (previous: 125)  Right Shoulder Internal Rotation 90 Degrees (previous: 15)  Right Shoulder External Rotation 72 Degrees (previous: 30)  Cervical Flexion 2.0 cm chin to chest (previous: same (5/12: 30 degrees))  Cervical Extension 14.0 cm chin to chest (previous: same (5/12: 35 degrees))  Cervical - Right Side Bend 40 (previous: 30)  Cervical - Left Side Bend 21 (previous: 19)  Cervical - Right Rotation 62 (previous: 15)  Cervical - Left Rotation 70 (previous: 30)  Strength  Right Shoulder Flexion 4+/5 (previous: same)  Right Shoulder ABduction 5/5 (previous: 4+/5)  Right Shoulder Internal Rotation 5/5 (previous: 4+/5)  Right Shoulder External Rotation 5/5 (previuos: 4+/5)  Strength Assessment Site Shoulder  Right/Left Shoulder Right  Overall Strength Comments Assessed seated. IR/er adducted           OT Education - 05/18/20 0829    Education Details shoulder stability HEP - Ball on wall, band loop wall slides    Person(s) Educated Patient    Methods  Explanation;Demonstration;Handout;Verbal cues    Comprehension Returned demonstration;Verbalized understanding            OT Short Term Goals - 05/14/20 0836      OT SHORT TERM GOAL #1   Title Patient will be educated and independent with HEP for improved cervical and shoulder mobility.    Time 6    Period Weeks    Status Achieved    Target Date 05/13/20      OT SHORT TERM GOAL #2   Title Patient will improve right shoulder and cervical a/rom to WNL in order to reach overhead, behind back, and look to right and left when driving without pain.    Time 6    Period Weeks    Status On-going      OT SHORT TERM GOAL #3   Title Patient will increase right shoulder strength to WNL in order to return to golfing and fishing without difficulty.    Time 6    Period Weeks    Status On-going      OT SHORT TERM GOAL #4   Title Patient will decrease pain in her right shoulder and cervical region to 2/10 or better when completing desired daily tasks.    Time 6    Period Weeks    Status On-going      OT SHORT TERM GOAL #5   Title Patient will decrease fascial restrictions to trace in right shoulder and cervical region.    Time 6    Period Weeks    Status On-going                    Plan - 05/18/20 1694    Clinical Impression Statement A: Reassessment completed this date. Due to family issues, patient was unable to attend therapy although reports that she feels it has been helping. She has met 1/5 therapy goals. She continues to experience increased pain, fascial restrictions, and decreased ROM and shoulder/scapular stability. The areas that she has shown improvement are ROM for external and internal rotation and right side cervical rotation. Her shoulder strength has improved overall although reports increased difficulty with sustained shoulder and scapular stability. Recommend continuing with skilled OT services 2x a week for 4 more weeks to focus on mentioned deficits.    Body  Structure / Function / Physical Skills ADL;Strength;Pain;GMC;UE functional use;IADL;ROM;Fascial restriction;FMC;Muscle spasms    OT Frequency 2x / week    OT Duration 4 weeks    Plan P: Contine with skilled OT services 2X a week for 4 weeks focusing on mentioned deficits above. Increase to 3-4 minutes on UBE bike forward while attempting 1 minute in reverse.    OT Home Exercise Plan eval:  shoulder and cervical stretches, cervical a/rom 4/14: SCM stretch, yoga strap upper trapezius stretch. 5/12: band loop wall slide, ball on the wall.    Consulted and Agree with Plan of Care Patient  Patient will benefit from skilled therapeutic intervention in order to improve the following deficits and impairments:   Body Structure / Function / Physical Skills: ADL,Strength,Pain,GMC,UE functional use,IADL,ROM,Fascial restriction,FMC,Muscle spasms       Visit Diagnosis: Other symptoms and signs involving the musculoskeletal system - Plan: Ot plan of care cert/re-cert  Acute pain of right shoulder - Plan: Ot plan of care cert/re-cert  Stiffness of right shoulder, not elsewhere classified - Plan: Ot plan of care cert/re-cert  Cervicalgia - Plan: Ot plan of care cert/re-cert    Problem List Patient Active Problem List   Diagnosis Date Noted  . Menorrhagia with regular cycle 12/13/2019  . Prehypertension 12/13/2019  . Abnormal blood level of copper 10/03/2014  . Morbid obesity ([place]) 05/11/2014  . Eczema 04/27/2012  . GERD (gastroesophageal reflux disease) 01/18/2012  . Encounter to establish care 10/30/2011   Ailene Ravel, OTR/L,CBIS  [phone]  05/18/2020, 8:55 AM  [place] [address] [place], [place], [address] Phone: [phone]   Fax:  [phone]  Name: Nashiya Disbrow MRN: [id number] Date of Birth: 2000-02-02

## 2020-05-19 ENCOUNTER — Ambulatory Visit (INDEPENDENT_AMBULATORY_CARE_PROVIDER_SITE_OTHER): Payer: Commercial Managed Care - PPO | Admitting: Orthopaedic Surgery

## 2020-05-19 ENCOUNTER — Encounter: Payer: Self-pay | Admitting: Orthopaedic Surgery

## 2020-05-19 ENCOUNTER — Encounter (HOSPITAL_COMMUNITY): Payer: Self-pay

## 2020-05-19 ENCOUNTER — Other Ambulatory Visit: Payer: Self-pay

## 2020-05-19 VITALS — BP 125/88 | HR 84 | Ht 60.0 in | Wt 231.2 lb

## 2020-05-19 DIAGNOSIS — G8929 Other chronic pain: Secondary | ICD-10-CM

## 2020-05-19 DIAGNOSIS — M25511 Pain in right shoulder: Secondary | ICD-10-CM

## 2020-05-19 NOTE — Progress Notes (Signed)
Patient Katherine Brown, female DOB:2000/10/06, 20 y.o. QTM:[id number]  Chief Complaint  Patient presents with  . Shoulder Pain    R/ it hurts and its making my neck hurt. I am here to go over MRI    HPI  Katherine Brown is a 20 y.o. female who has right shoulder pain.  She has been to OT and has another month of OT.  I have reviewed the notes.  She had MRI of the right shoulder which was read as unremarkable, negative.  I have informed her of the findings.  She still has pain.  I have independently reviewed the MRI.        Body mass index is 45.16 kg/m.  The patient meets the AMA guidelines for Morbid (severe) obesity with a BMI > 40.0 and I have recommended weight loss.   ROS  Review of Systems  All other systems reviewed and are negative.  The following is a summary of the past history medically, past history surgically, known current medicines, social history and family history.  This information is gathered electronically by the computer from prior information and documentation.  I review this each visit and have found including this information at this point in the chart is beneficial and informative.    Past Medical History:  Diagnosis Date  . Abnormal blood level of copper 10/03/2014  . Allergic state 05/11/2014  . Bronchitis 01/10/2012  . Constipation 07/05/2015  . Headache(784.0) 10/30/2011  . Low back pain 10/03/2014  . Nausea in pediatric patient 11/02/2011  . Obesity 05/11/2014  . Precocious puberty   . Reflux 11/02/2011  . Unspecified sinusitis (chronic) 01/10/2012  . Urinary frequency 08/30/2015  . WCC (well child check) 10/30/2011    Past Surgical History:  Procedure Laterality Date  . TONSILLECTOMY AND ADENOIDECTOMY  20 yrs old    Family History  Problem Relation Age of Onset  . Asthma Mother   . Hypertension Maternal Grandmother     Social History Social History   Tobacco Use  . Smoking status: Never Smoker  . Smokeless tobacco: Never Used   Substance Use Topics  . Alcohol use: No  . Drug use: No    No Known Allergies  Current Outpatient Medications  Medication Sig Dispense Refill  . AUROVELA FE 1/20 1-20 MG-MCG tablet TAKE 1 TABLET BY MOUTH AT BEDTIME 28 tablet 5  . ibuprofen (ADVIL) 800 MG tablet Take 1 tablet (800 mg total) by mouth every 8 (eight) hours as needed. 30 tablet 0  . methocarbamol (ROBAXIN) 500 MG tablet Take 1 tablet (500 mg total) by mouth 3 (three) times daily. 60 tablet 1  . naproxen (NAPROSYN) 500 MG tablet Take 1 tablet (500 mg total) by mouth 2 (two) times daily with a meal. 60 tablet 5  . triamcinolone (KENALOG) 0.1 % Apply 1 application topically 2 (two) times daily. 30 g 1   No current facility-administered medications for this visit.     Physical Exam  Blood pressure 125/88, pulse 84, height 5' (1.524 m), weight 231 lb 4 oz (104.9 kg).  Constitutional: overall normal hygiene, normal nutrition, well developed, normal grooming, normal body habitus. Assistive device:none  Musculoskeletal: gait and station Limp none, muscle tone and strength are normal, no tremors or atrophy is present.  .  Neurological: coordination overall normal.  Deep tendon reflex/nerve stretch intact.  Sensation normal.  Cranial nerves II-XII intact.   Skin:   Normal overall no scars, lesions, ulcers or rashes. No psoriasis.  Psychiatric: Alert and  oriented x 3.  Recent memory intact, remote memory unclear.  Normal mood and affect. Well groomed.  Good eye contact.  Cardiovascular: overall no swelling, no varicosities, no edema bilaterally, normal temperatures of the legs and arms, no clubbing, cyanosis and good capillary refill.  Lymphatic: palpation is normal.  Right shoulder motion is full with pain in the extremes.  NV intact.  All other systems reviewed and are negative   The patient has been educated about the nature of the problem(s) and counseled on treatment options.  The patient appeared to understand what  I have discussed and is in agreement with it.  Encounter Diagnosis  Name Primary?  . Chronic right shoulder pain Yes    PLAN Call if any problems.  Precautions discussed.  Continue current medications.   Return to clinic 1 month   Continue OT.  I have recommended some adjustments to her desk to help her with supporting her shoulder while on computer.   Electronically Signed Darreld Mclean, MD 5/17/20228:16 AM

## 2020-05-26 ENCOUNTER — Encounter (HOSPITAL_COMMUNITY): Payer: Self-pay

## 2020-06-02 ENCOUNTER — Ambulatory Visit (HOSPITAL_COMMUNITY): Payer: Commercial Managed Care - PPO

## 2020-06-05 ENCOUNTER — Encounter (HOSPITAL_COMMUNITY): Payer: Commercial Managed Care - PPO

## 2020-06-11 ENCOUNTER — Encounter (HOSPITAL_COMMUNITY): Payer: Commercial Managed Care - PPO | Admitting: Occupational Therapy

## 2020-06-11 ENCOUNTER — Encounter (HOSPITAL_COMMUNITY): Payer: Self-pay

## 2020-06-12 ENCOUNTER — Ambulatory Visit: Payer: Commercial Managed Care - PPO | Admitting: Internal Medicine

## 2020-06-16 ENCOUNTER — Ambulatory Visit: Payer: Commercial Managed Care - PPO | Admitting: Orthopaedic Surgery

## 2020-06-17 ENCOUNTER — Encounter (HOSPITAL_COMMUNITY): Payer: Self-pay

## 2020-06-17 NOTE — Therapy (Signed)
[place], [place], [address] Phone: [phone]   Fax:  [phone]  Patient Details  Name: Katherine Brown MRN: [id number] Date of Birth: 2000/02/21 Referring Provider:  No ref. provider found  Encounter Date: 06/17/2020  OCCUPATIONAL THERAPY DISCHARGE SUMMARY  Visits from Start of Care: 5  Current functional level related to goals / functional outcomes: OT SHORT TERM GOAL #1     Title Patient will be educated and independent with HEP for improved cervical and shoulder mobility.    Time 6    Period Weeks    Status Achieved    Target Date 05/13/20         OT SHORT TERM GOAL #2    Title Patient will improve right shoulder and cervical a/rom to WNL in order to reach overhead, behind back, and look to right and left when driving without pain.    Time 6    Period Weeks    Status On-going         OT SHORT TERM GOAL #3    Title Patient will increase right shoulder strength to WNL in order to return to golfing and fishing without difficulty.    Time 6    Period Weeks    Status On-going         OT SHORT TERM GOAL #4    Title Patient will decrease pain in her right shoulder and cervical region to 2/10 or better when completing desired daily tasks.    Time 6    Period Weeks    Status On-going         OT SHORT TERM GOAL #5    Title Patient will decrease fascial restrictions to trace in right shoulder and cervical region.    Time 6    Period Weeks    Status On-going      Remaining deficits: As attendance to therapy was an issue, patient's progress was minimal although she reports she felt like it was helping her. At last reassessment and visit on 05/14/20, she had met 1 therapy goal. She continues to have pain, fascial restrictions, and decrease strength and ROM.    Education / Equipment: HEP provided during session for shoulder and neck. Sleep position modification were discussed to help with cervical pain.     Patient agrees to discharge. Patient goals were not met. Patient is being discharged due to not returning since the last visit.and difficulty with attendance to appointments.      Ailene Ravel, OTR/L,CBIS  [phone]   06/17/2020, 9:17 AM  [facility] [address] [place], [place], [address] Phone: [phone]   Fax:  [phone]

## 2020-08-25 ENCOUNTER — Encounter: Payer: Self-pay | Admitting: Internal Medicine

## 2020-09-15 ENCOUNTER — Ambulatory Visit (INDEPENDENT_AMBULATORY_CARE_PROVIDER_SITE_OTHER): Payer: Commercial Managed Care - PPO | Admitting: Internal Medicine

## 2020-09-15 ENCOUNTER — Other Ambulatory Visit: Payer: Self-pay

## 2020-09-15 ENCOUNTER — Encounter: Payer: Self-pay | Admitting: Internal Medicine

## 2020-09-15 DIAGNOSIS — E782 Mixed hyperlipidemia: Secondary | ICD-10-CM

## 2020-09-15 MED ORDER — PHENTERMINE HCL 37.5 MG PO TABS
18.7500 mg | ORAL_TABLET | Freq: Every day | ORAL | 0 refills | Status: AC
Start: 2020-09-15 — End: ?

## 2020-09-15 NOTE — Assessment & Plan Note (Signed)
Advised low carb and low cholesterol diet Check lipid profile in the next visit

## 2020-09-15 NOTE — Progress Notes (Signed)
Virtual Visit via Telephone Note   This visit type was conducted due to national recommendations for restrictions regarding the COVID-19 Pandemic (e.g. social distancing) in an effort to limit this patient's exposure and mitigate transmission in our community.  Due to her co-morbid illnesses, this patient is at least at moderate risk for complications without adequate follow up.  This format is felt to be most appropriate for this patient at this time.  The patient did not have access to video technology/had technical difficulties with video requiring transitioning to audio format only (telephone).  All issues noted in this document were discussed and addressed.  No physical exam could be performed with this format.  Evaluation Performed:  Follow-up visit  Date:  09/15/2020   ID:  Katherine Brown, DOB [birth year], MRN [id number]  Patient Location: Home Provider Location: Office/Clinic  Participants: Patient Location of Patient: Home Location of Provider: Telehealth Consent was obtain for visit to be over via telehealth. I verified that I am speaking with the correct person using two identifiers.  PCP:  Anabel Halon, MD   Chief Complaint:  Obesity  History of Present Illness:    Katherine Brown is a 20 y.o. female who has a telehealth visit for obesity and weight loss management.  She states that she has been following low-carb diet and has cut down on sodas as well. She asks if she can take any medical help.  She was provided diet modification and exercise advice in the last visit.  She has gained about 2 lbs since 12/2019.  She denies any fatigue, polyuria, polyphagia, chest pain, dyspnea or palpitations.  She is willing to continue to follow low-carb diet.  The patient does not have symptoms concerning for COVID-19 infection (fever, chills, cough, or new shortness of breath).   Past Medical, Surgical, Social History, Allergies, and Medications have been Reviewed.  Past Medical  History:  Diagnosis Date   Abnormal blood level of copper 10/03/2014   Allergic state 05/11/2014   Bronchitis 01/10/2012   Constipation 07/05/2015   Headache(784.0) 10/30/2011   Low back pain 10/03/2014   Nausea in pediatric patient 11/02/2011   Obesity 05/11/2014   Precocious puberty    Reflux 11/02/2011   Unspecified sinusitis (chronic) 01/10/2012   Urinary frequency 08/30/2015   WCC (well child check) 10/30/2011   Past Surgical History:  Procedure Laterality Date   TONSILLECTOMY AND ADENOIDECTOMY  20 yrs old     Current Meds  Medication Sig   AUROVELA FE 1/20 1-20 MG-MCG tablet TAKE 1 TABLET BY MOUTH AT BEDTIME   naproxen (NAPROSYN) 500 MG tablet Take 1 tablet (500 mg total) by mouth 2 (two) times daily with a meal.   phentermine (ADIPEX-P) 37.5 MG tablet Take 0.5 tablets (18.75 mg total) by mouth daily before breakfast.   triamcinolone (KENALOG) 0.1 % Apply 1 application topically 2 (two) times daily.     Allergies:   Patient has no known allergies.   ROS:   Please see the history of present illness.     All other systems reviewed and are negative.   Labs/Other Tests and Data Reviewed:    Recent Labs: 12/13/2019: ALT 16; BUN 10; Creatinine, Ser 0.76; Hemoglobin 13.5; Platelets 296; Potassium 4.3; Sodium 138; TSH 1.570   Recent Lipid Panel Lab Results  Component Value Date/Time   CHOL 202 (H) 12/13/2019 08:46 AM   TRIG 116 12/13/2019 08:46 AM   HDL 46 12/13/2019 08:46 AM   CHOLHDL 4.4 12/13/2019 08:46 AM  LDLCALC 135 (H) 12/13/2019 08:46 AM    Wt Readings from Last 3 Encounters:  09/15/20 232 lb (105.2 kg)  05/19/20 231 lb 4 oz (104.9 kg)  04/23/20 232 lb (105.2 kg)     ASSESSMENT & PLAN:    Morbid obesity (HCC) Continue low-carb diet, small, frequent meals Started phentermine half tablet once daily Moderate exercise advised Check CMP and lipid profile in the next visit  Mixed hyperlipidemia Advised low carb and low cholesterol diet Check lipid profile in the  next visit   Time:   Today, I have spent 12 minutes reviewing the chart, including problem list, medications, and with the patient with telehealth technology discussing the above problems.   Medication Adjustments/Labs and Tests Ordered: Current medicines are reviewed at length with the patient today.  Concerns regarding medicines are outlined above.   Tests Ordered: No orders of the defined types were placed in this encounter.   Medication Changes: Meds ordered this encounter  Medications   phentermine (ADIPEX-P) 37.5 MG tablet    Sig: Take 0.5 tablets (18.75 mg total) by mouth daily before breakfast.    Dispense:  30 tablet    Refill:  0     Note: This dictation was prepared with Dragon dictation along with smaller phrase technology. Similar sounding words can be transcribed inadequately or may not be corrected upon review. Any transcriptional errors that result from this process are unintentional.      Disposition:  Follow up  Signed, Anabel Halon, MD  09/15/2020 8:19 AM     [facility]

## 2020-09-15 NOTE — Assessment & Plan Note (Addendum)
Continue low-carb diet, small, frequent meals Started phentermine half tablet once daily Moderate exercise advised Check CMP and lipid profile in the next visit

## 2020-11-17 ENCOUNTER — Ambulatory Visit: Payer: Commercial Managed Care - PPO | Admitting: Internal Medicine

## 2020-11-22 ENCOUNTER — Other Ambulatory Visit: Payer: Self-pay | Admitting: Internal Medicine

## 2020-11-22 DIAGNOSIS — N920 Excessive and frequent menstruation with regular cycle: Secondary | ICD-10-CM

## 2021-04-12 ENCOUNTER — Other Ambulatory Visit: Payer: Self-pay | Admitting: Internal Medicine

## 2021-04-12 DIAGNOSIS — N920 Excessive and frequent menstruation with regular cycle: Secondary | ICD-10-CM

## 2022-03-03 ENCOUNTER — Encounter: Payer: Self-pay | Admitting: Radiology
